# Patient Record
Sex: Male | Born: 1935 | Race: White | Hispanic: No | Smoking: Current some day smoker
Health system: Southern US, Community
[De-identification: ages and names within clinical notes are randomized; demographics above are authoritative.]

## PROBLEM LIST (undated history)

## (undated) DIAGNOSIS — I10 Essential (primary) hypertension: Secondary | ICD-10-CM

## (undated) DIAGNOSIS — I2510 Atherosclerotic heart disease of native coronary artery without angina pectoris: Secondary | ICD-10-CM

## (undated) DIAGNOSIS — I471 Supraventricular tachycardia, unspecified: Secondary | ICD-10-CM

## (undated) DIAGNOSIS — R911 Solitary pulmonary nodule: Secondary | ICD-10-CM

## (undated) DIAGNOSIS — F32A Depression, unspecified: Secondary | ICD-10-CM

## (undated) DIAGNOSIS — C61 Malignant neoplasm of prostate: Secondary | ICD-10-CM

## (undated) DIAGNOSIS — M109 Gout, unspecified: Secondary | ICD-10-CM

## (undated) DIAGNOSIS — Z86711 Personal history of pulmonary embolism: Secondary | ICD-10-CM

## (undated) DIAGNOSIS — K219 Gastro-esophageal reflux disease without esophagitis: Secondary | ICD-10-CM

## (undated) DIAGNOSIS — E7800 Pure hypercholesterolemia, unspecified: Secondary | ICD-10-CM

## (undated) DIAGNOSIS — F329 Major depressive disorder, single episode, unspecified: Secondary | ICD-10-CM

## (undated) DIAGNOSIS — I480 Paroxysmal atrial fibrillation: Secondary | ICD-10-CM

## (undated) HISTORY — PX: HERNIA REPAIR: SHX51

## (undated) HISTORY — PX: HEMORRHOID SURGERY: SHX153

## (undated) HISTORY — DX: Paroxysmal atrial fibrillation: I48.0

## (undated) HISTORY — DX: Supraventricular tachycardia: I47.1

## (undated) HISTORY — PX: FRACTURE SURGERY: SHX138

## (undated) HISTORY — PX: PROSTATE SURGERY: SHX751

## (undated) HISTORY — DX: Supraventricular tachycardia, unspecified: I47.10

## (undated) HISTORY — PX: TONSILLECTOMY: SUR1361

## (undated) HISTORY — DX: Atherosclerotic heart disease of native coronary artery without angina pectoris: I25.10

## (undated) HISTORY — DX: Malignant neoplasm of prostate: C61

## (undated) HISTORY — DX: Essential (primary) hypertension: I10

## (undated) HISTORY — DX: Gastro-esophageal reflux disease without esophagitis: K21.9

## (undated) HISTORY — DX: Solitary pulmonary nodule: R91.1

## (undated) HISTORY — PX: OTHER SURGICAL HISTORY: SHX169

## (undated) HISTORY — DX: Personal history of pulmonary embolism: Z86.711

---

## 1998-05-21 ENCOUNTER — Ambulatory Visit (HOSPITAL_COMMUNITY): Admission: RE | Admit: 1998-05-21 | Discharge: 1998-05-21 | Payer: Self-pay | Admitting: General Surgery

## 2000-08-13 ENCOUNTER — Encounter: Admission: RE | Admit: 2000-08-13 | Discharge: 2000-11-11 | Payer: Self-pay | Admitting: Orthopaedic Surgery

## 2001-08-16 ENCOUNTER — Other Ambulatory Visit: Admission: RE | Admit: 2001-08-16 | Discharge: 2001-08-16 | Payer: Self-pay | Admitting: Dermatology

## 2002-01-03 ENCOUNTER — Encounter: Admission: RE | Admit: 2002-01-03 | Discharge: 2002-04-03 | Payer: Self-pay | Admitting: Neurological Surgery

## 2003-05-08 ENCOUNTER — Other Ambulatory Visit: Admission: RE | Admit: 2003-05-08 | Discharge: 2003-05-08 | Payer: Self-pay | Admitting: Dermatology

## 2004-05-21 ENCOUNTER — Ambulatory Visit: Payer: Self-pay | Admitting: Family Medicine

## 2004-07-04 ENCOUNTER — Ambulatory Visit: Payer: Self-pay | Admitting: Family Medicine

## 2004-10-02 ENCOUNTER — Ambulatory Visit: Payer: Self-pay | Admitting: Family Medicine

## 2004-11-25 ENCOUNTER — Ambulatory Visit: Payer: Self-pay | Admitting: Family Medicine

## 2005-02-03 ENCOUNTER — Ambulatory Visit: Payer: Self-pay | Admitting: Family Medicine

## 2005-02-24 ENCOUNTER — Ambulatory Visit: Payer: Self-pay | Admitting: Family Medicine

## 2005-02-26 ENCOUNTER — Other Ambulatory Visit: Admission: RE | Admit: 2005-02-26 | Discharge: 2005-02-26 | Payer: Self-pay | Admitting: Dermatology

## 2005-02-28 ENCOUNTER — Ambulatory Visit: Payer: Self-pay | Admitting: Family Medicine

## 2005-03-07 ENCOUNTER — Ambulatory Visit: Payer: Self-pay | Admitting: Family Medicine

## 2005-04-04 ENCOUNTER — Ambulatory Visit: Payer: Self-pay | Admitting: Family Medicine

## 2005-05-26 ENCOUNTER — Ambulatory Visit: Payer: Self-pay | Admitting: Family Medicine

## 2005-10-09 ENCOUNTER — Ambulatory Visit: Payer: Self-pay | Admitting: Family Medicine

## 2005-12-15 ENCOUNTER — Ambulatory Visit: Payer: Self-pay | Admitting: Family Medicine

## 2006-02-09 ENCOUNTER — Ambulatory Visit: Payer: Self-pay | Admitting: Family Medicine

## 2006-04-10 ENCOUNTER — Ambulatory Visit: Payer: Self-pay | Admitting: Family Medicine

## 2006-06-17 ENCOUNTER — Ambulatory Visit: Payer: Self-pay | Admitting: Family Medicine

## 2006-09-08 ENCOUNTER — Ambulatory Visit: Payer: Self-pay | Admitting: Family Medicine

## 2006-11-04 ENCOUNTER — Ambulatory Visit: Payer: Self-pay | Admitting: Family Medicine

## 2006-11-16 ENCOUNTER — Ambulatory Visit: Payer: Self-pay | Admitting: Family Medicine

## 2007-11-10 ENCOUNTER — Encounter: Admission: RE | Admit: 2007-11-10 | Discharge: 2007-11-24 | Payer: Self-pay | Admitting: Orthopedic Surgery

## 2007-12-14 ENCOUNTER — Encounter
Admission: RE | Admit: 2007-12-14 | Discharge: 2008-02-15 | Payer: Self-pay | Admitting: Physical Medicine and Rehabilitation

## 2008-08-09 ENCOUNTER — Ambulatory Visit (HOSPITAL_COMMUNITY): Admission: RE | Admit: 2008-08-09 | Discharge: 2008-08-09 | Payer: Self-pay | Admitting: Ophthalmology

## 2008-08-25 ENCOUNTER — Ambulatory Visit: Payer: Self-pay | Admitting: Urology

## 2008-08-30 ENCOUNTER — Ambulatory Visit (HOSPITAL_COMMUNITY): Admission: RE | Admit: 2008-08-30 | Discharge: 2008-08-30 | Payer: Self-pay | Admitting: Ophthalmology

## 2008-10-12 ENCOUNTER — Ambulatory Visit (HOSPITAL_COMMUNITY): Admission: RE | Admit: 2008-10-12 | Discharge: 2008-10-12 | Payer: Self-pay | Admitting: Ophthalmology

## 2009-06-26 ENCOUNTER — Ambulatory Visit: Admission: RE | Admit: 2009-06-26 | Discharge: 2009-09-24 | Payer: Self-pay | Admitting: Radiation Oncology

## 2009-08-17 ENCOUNTER — Ambulatory Visit (HOSPITAL_BASED_OUTPATIENT_CLINIC_OR_DEPARTMENT_OTHER): Admission: RE | Admit: 2009-08-17 | Discharge: 2009-08-17 | Payer: Self-pay | Admitting: Urology

## 2009-09-24 ENCOUNTER — Ambulatory Visit: Admission: RE | Admit: 2009-09-24 | Discharge: 2009-10-04 | Payer: Self-pay | Admitting: Radiation Oncology

## 2009-11-02 IMAGING — US US RENAL KIDNEY
1 series · 17 of 25 positions shown · non-contrast
Comparison: none

REASON FOR EXAM: flank pain   BPH   hematuria
COMMENTS:

[Series 1: us renal kidney · 17 of 36 slices shown]
[im 1/36]
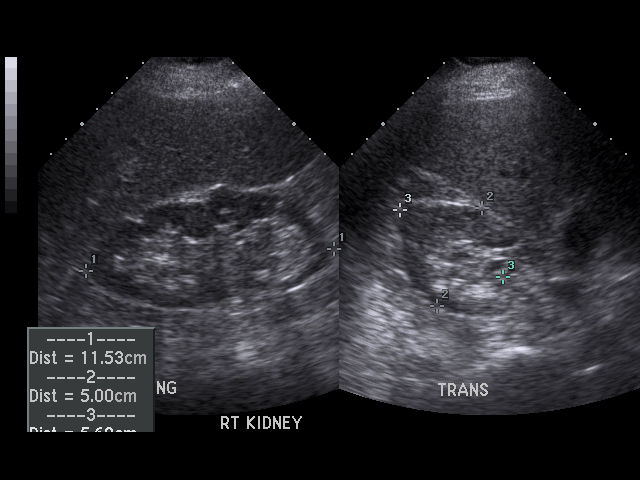
[im 3/36]
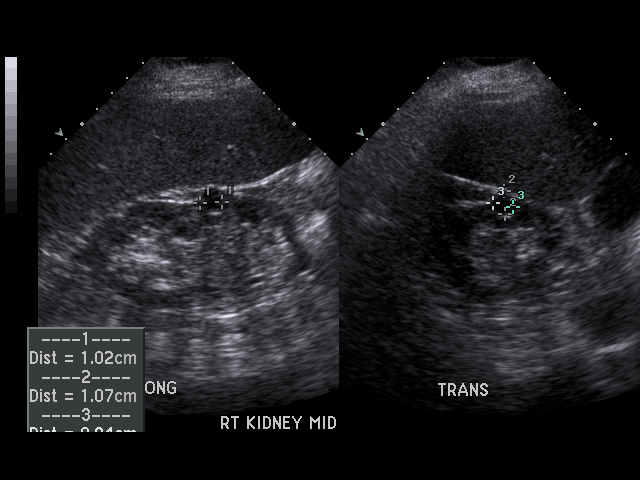
[im 5/36]
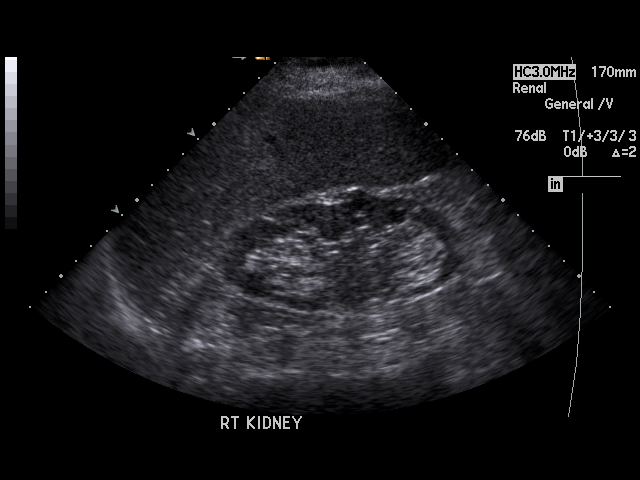
[im 8/36]
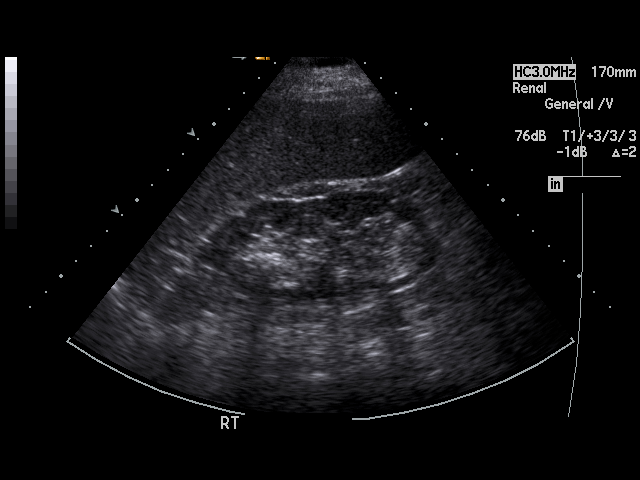
[im 9/36]
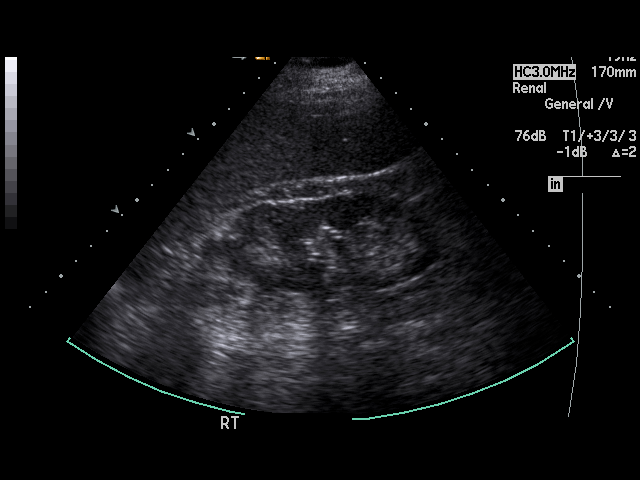
[im 12/36]
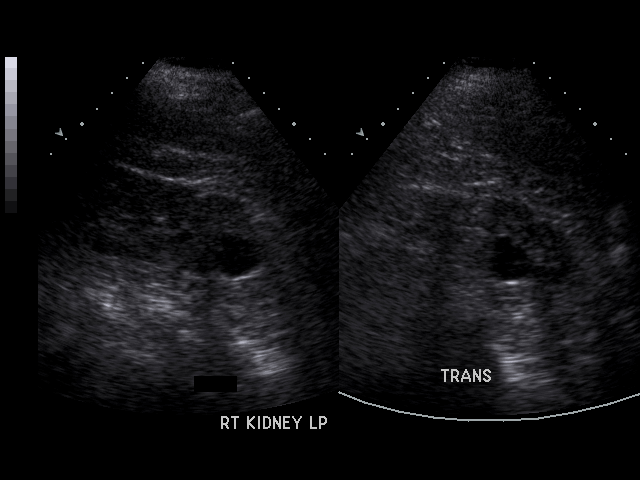
[im 14/36]
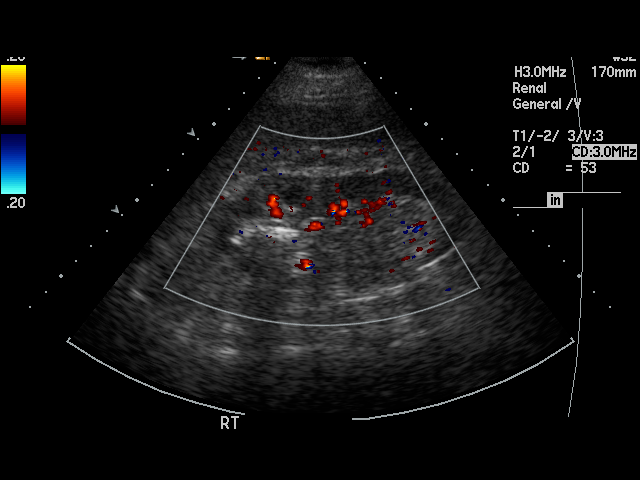
[im 17/36]
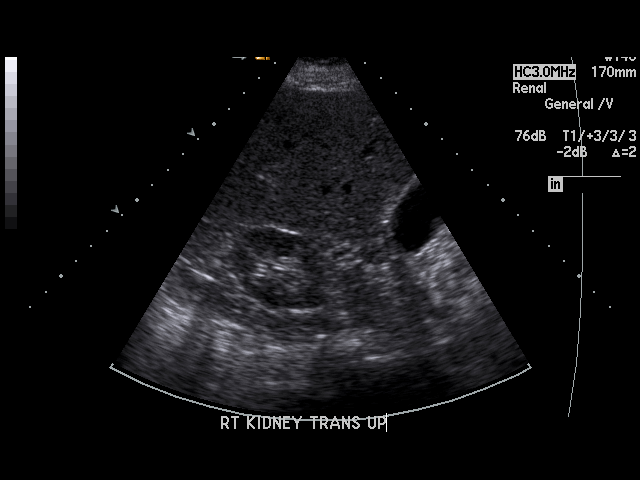
[im 18/36]
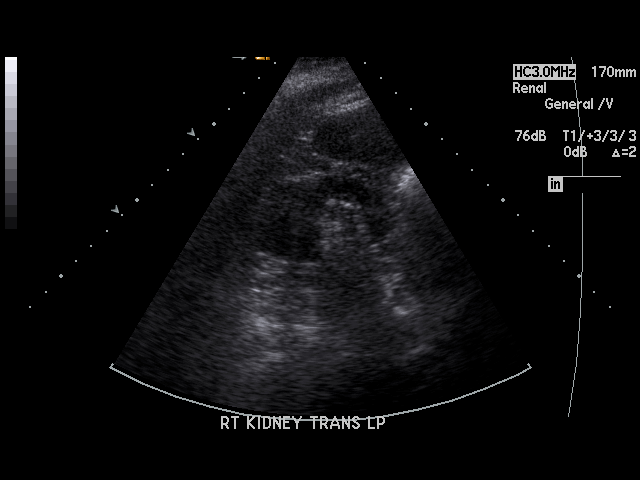
[im 19/36]
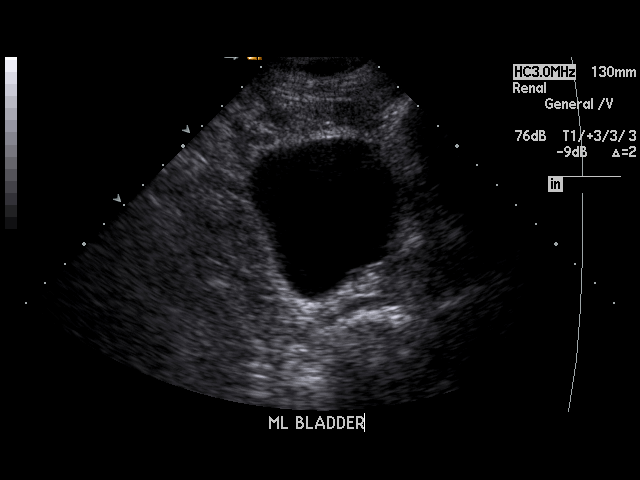
[im 22/36]
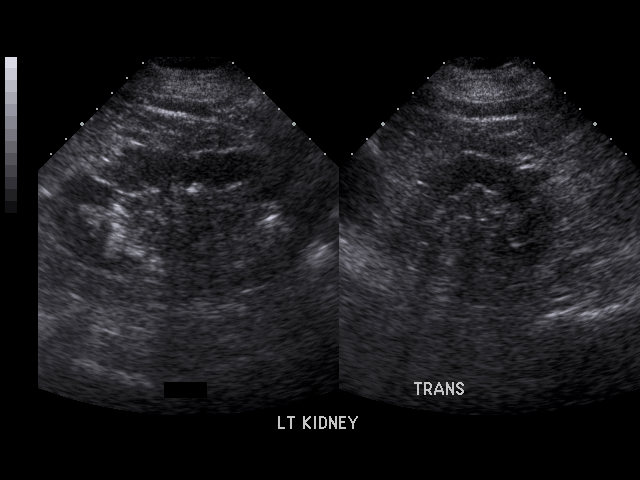
[im 24/36]
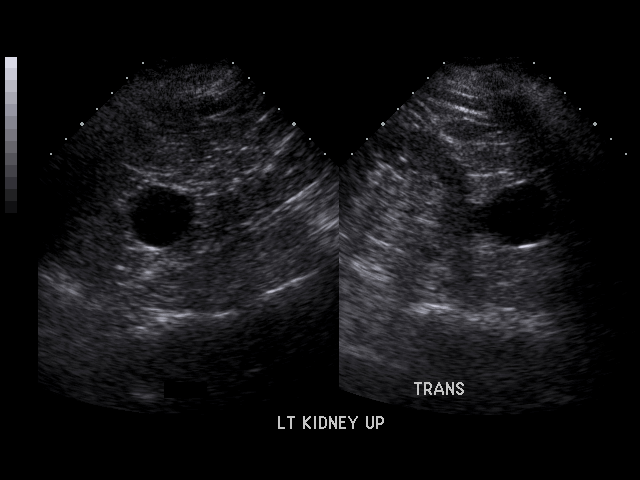
[im 27/36]
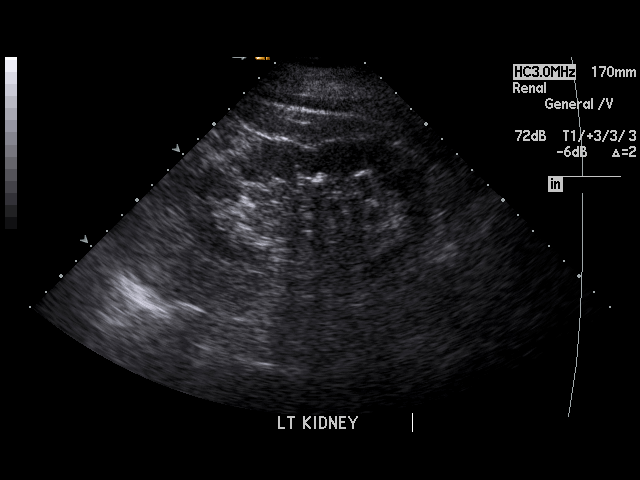
[im 28/36]
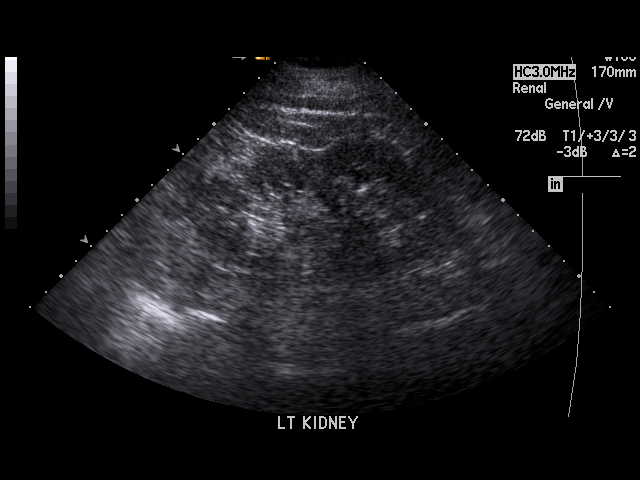
[im 31/36]
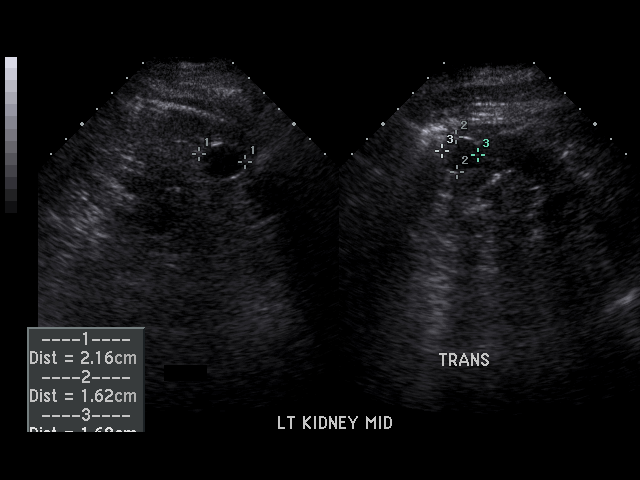
[im 33/36]
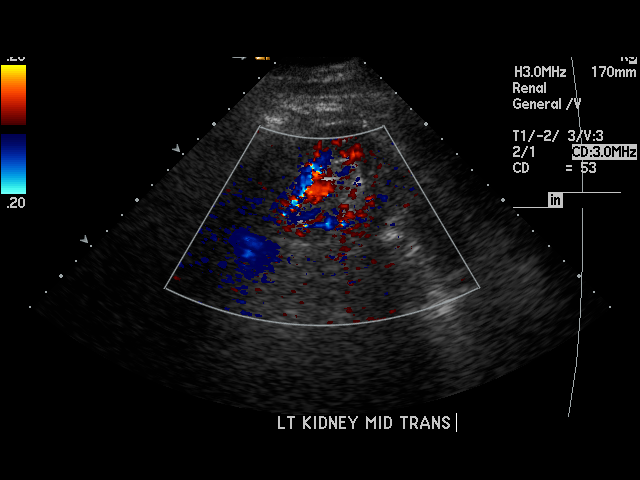
[im 36/36]
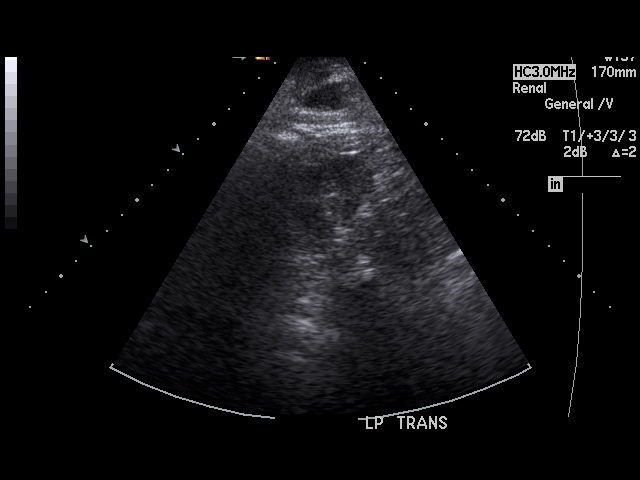

[17 of 25 positions shown; findings below may reference images not displayed]

PROCEDURE:     US  - US KIDNEY  - August 25, 2008  [DATE]

RESULT:     Sonographic investigation of the kidneys demonstrates the RIGHT
kidney measures 11.53 x 5.0 x 5.7 cm. The LEFT kidney measures 12.6 x 7.1 x
4.7 cm. There is a lower pole cyst in the RIGHT kidney measuring
approximately 1.8 x 2.1 x 1.6 cm. There is urine in the bladder. There is a
LEFT renal cyst in the upper pole measuring approximately 3 cm x 3.1 x
cm. There is a mid pole LEFT renal cyst measuring up to approximately
cm. Neither kidney demonstrates hydronephrosis. There is mild cortical
thinning.
IMPRESSION: Renal cysts. The study is otherwise unremarkable.

## 2010-09-05 LAB — COMPREHENSIVE METABOLIC PANEL
Albumin: 4.4 g/dL (ref 3.5–5.2)
Alkaline Phosphatase: 74 U/L (ref 39–117)
BUN: 8 mg/dL (ref 6–23)
Calcium: 10 mg/dL (ref 8.4–10.5)
Glucose, Bld: 92 mg/dL (ref 70–99)
Potassium: 4.9 mEq/L (ref 3.5–5.1)
Sodium: 141 mEq/L (ref 135–145)
Total Protein: 7.4 g/dL (ref 6.0–8.3)

## 2010-09-05 LAB — CBC
HCT: 43.8 % (ref 39.0–52.0)
Hemoglobin: 15.3 g/dL (ref 13.0–17.0)
MCHC: 34.8 g/dL (ref 30.0–36.0)
Platelets: 253 10*3/uL (ref 150–400)
RDW: 12.4 % (ref 11.5–15.5)

## 2010-09-05 LAB — PROTIME-INR
INR: 1.04 (ref 0.00–1.49)
Prothrombin Time: 13.5 seconds (ref 11.6–15.2)

## 2010-10-01 LAB — BASIC METABOLIC PANEL
Calcium: 9.8 mg/dL (ref 8.4–10.5)
GFR calc Af Amer: >60 mL/min (ref >60)
GFR calc non Af Amer: >60 mL/min (ref >60)
Potassium: 4.4 mEq/L (ref 3.5–5.1)
Sodium: 140 mEq/L (ref 135–145)

## 2010-10-01 LAB — HEMOGLOBIN AND HEMATOCRIT, BLOOD
HCT: 44.3 % (ref 39.0–52.0)
Hemoglobin: 15.4 g/dL (ref 13.0–17.0)

## 2013-01-10 ENCOUNTER — Emergency Department (HOSPITAL_COMMUNITY)
Admission: EM | Admit: 2013-01-10 | Discharge: 2013-01-10 | Disposition: A | Discharge status: Home / Self Care | Payer: Medicare Other | Attending: Emergency Medicine | Admitting: Emergency Medicine

## 2013-01-10 ENCOUNTER — Emergency Department (HOSPITAL_COMMUNITY): Payer: Medicare Other

## 2013-01-10 ENCOUNTER — Encounter (HOSPITAL_COMMUNITY): Payer: Self-pay

## 2013-01-10 DIAGNOSIS — M25519 Pain in unspecified shoulder: Secondary | ICD-10-CM | POA: Insufficient documentation

## 2013-01-10 DIAGNOSIS — K219 Gastro-esophageal reflux disease without esophagitis: Secondary | ICD-10-CM | POA: Insufficient documentation

## 2013-01-10 DIAGNOSIS — Z8639 Personal history of other endocrine, nutritional and metabolic disease: Secondary | ICD-10-CM | POA: Insufficient documentation

## 2013-01-10 DIAGNOSIS — R079 Chest pain, unspecified: Secondary | ICD-10-CM

## 2013-01-10 DIAGNOSIS — Z79899 Other long term (current) drug therapy: Secondary | ICD-10-CM | POA: Insufficient documentation

## 2013-01-10 DIAGNOSIS — Z8546 Personal history of malignant neoplasm of prostate: Secondary | ICD-10-CM | POA: Insufficient documentation

## 2013-01-10 DIAGNOSIS — R5381 Other malaise: Secondary | ICD-10-CM | POA: Insufficient documentation

## 2013-01-10 DIAGNOSIS — R5383 Other fatigue: Secondary | ICD-10-CM | POA: Insufficient documentation

## 2013-01-10 DIAGNOSIS — R0789 Other chest pain: Secondary | ICD-10-CM | POA: Insufficient documentation

## 2013-01-10 DIAGNOSIS — R0602 Shortness of breath: Secondary | ICD-10-CM | POA: Insufficient documentation

## 2013-01-10 DIAGNOSIS — E7800 Pure hypercholesterolemia, unspecified: Secondary | ICD-10-CM | POA: Insufficient documentation

## 2013-01-10 DIAGNOSIS — I10 Essential (primary) hypertension: Secondary | ICD-10-CM | POA: Insufficient documentation

## 2013-01-10 DIAGNOSIS — Z8659 Personal history of other mental and behavioral disorders: Secondary | ICD-10-CM | POA: Insufficient documentation

## 2013-01-10 DIAGNOSIS — Z862 Personal history of diseases of the blood and blood-forming organs and certain disorders involving the immune mechanism: Secondary | ICD-10-CM | POA: Insufficient documentation

## 2013-01-10 DIAGNOSIS — Z87891 Personal history of nicotine dependence: Secondary | ICD-10-CM | POA: Insufficient documentation

## 2013-01-10 DIAGNOSIS — R42 Dizziness and giddiness: Secondary | ICD-10-CM | POA: Insufficient documentation

## 2013-01-10 HISTORY — DX: Depression, unspecified: F32.A

## 2013-01-10 HISTORY — DX: Gout, unspecified: M10.9

## 2013-01-10 HISTORY — DX: Pure hypercholesterolemia, unspecified: E78.00

## 2013-01-10 HISTORY — DX: Major depressive disorder, single episode, unspecified: F32.9

## 2013-01-10 LAB — CBC WITH DIFFERENTIAL/PLATELET
Basophils Absolute: 0 10*3/uL (ref 0.0–0.1)
Basophils Relative: 0 % (ref 0–1)
Eosinophils Absolute: 0.1 10*3/uL (ref 0.0–0.7)
Eosinophils Relative: 1 % (ref 0–5)
HCT: 39.5 % (ref 39.0–52.0)
Hemoglobin: 13.7 g/dL (ref 13.0–17.0)
Lymphocytes Relative: 28 % (ref 12–46)
Lymphs Abs: 2.8 10*3/uL (ref 0.7–4.0)
MCH: 34.8 pg — ABNORMAL HIGH (ref 26.0–34.0)
MCHC: 34.7 g/dL (ref 30.0–36.0)
MCV: 100.3 fL — ABNORMAL HIGH (ref 78.0–100.0)
Monocytes Absolute: 1.3 10*3/uL — ABNORMAL HIGH (ref 0.1–1.0)
Monocytes Relative: 13 % — ABNORMAL HIGH (ref 3–12)
Neutro Abs: 5.7 10*3/uL (ref 1.7–7.7)
Neutrophils Relative %: 58 % (ref 43–77)
Platelets: 182 10*3/uL (ref 150–400)
RBC: 3.94 MIL/uL — ABNORMAL LOW (ref 4.22–5.81)
RDW: 12.3 % (ref 11.5–15.5)
WBC: 9.9 10*3/uL (ref 4.0–10.5)

## 2013-01-10 LAB — BASIC METABOLIC PANEL
BUN: 9 mg/dL (ref 6–23)
CO2: 28 mEq/L (ref 19–32)
Calcium: 9.8 mg/dL (ref 8.4–10.5)
Chloride: 98 mEq/L (ref 96–112)
Creatinine, Ser: 0.64 mg/dL (ref 0.50–1.35)
GFR calc Af Amer: >90 mL/min (ref >90)
GFR calc non Af Amer: >90 mL/min (ref >90)
Glucose, Bld: 161 mg/dL — ABNORMAL HIGH (ref 70–99)
Potassium: 3.1 mEq/L — ABNORMAL LOW (ref 3.5–5.1)
Sodium: 136 mEq/L (ref 135–145)

## 2013-01-10 LAB — TROPONIN I: Troponin I: <0.3 ng/mL (ref <0.30)

## 2013-01-10 MED ORDER — POTASSIUM CHLORIDE CRYS ER 20 MEQ PO TBCR
40.0000 meq | EXTENDED_RELEASE_TABLET | Freq: Once | ORAL | Status: AC
  Administered 2013-01-10: 40 meq via ORAL
  Filled 2013-01-10: qty 2

## 2013-01-10 NOTE — ED Notes (Signed)
Pt reports left side cp all weekend, pain has comes and gone all weekend, but has been constant since 8am. No radiation, sob at times, pain to left shoulder at times this weekend also, denies any n/d or fever. No cough.

## 2013-01-10 NOTE — ED Provider Notes (Signed)
CSN: [id number]     Arrival date & time 01/10/13  1340 History  This chart was scribed for Jonathon Razor, MD, by Yevette Edwards, ED Scribe. This patient was seen in room APA06/APA06 and the patient's care was started at 2:33 PM.   First MD Initiated Contact with Patient 01/10/13 1412     Chief Complaint  Patient presents with  . Chest Pain    The history is provided by the patient. No language interpreter was used.   HPI Comments: Jonathon Conway is a 79 y.o. male who presents to the Emergency Department complaining of waxing and waning left-sided chest pain which began three days ago. No appreciable exacerbating or relieving factors. If does not completely go away.  Currently "nagging." Poorly localized to midsternum to L chest. He states that at its worst, the pain can be sharp and achy. He also states that the pain is not similar to his prior episodes of acid reflux. When the chest pain began three days ago, he visited his PCP where an EKG was performed with no significant results. When his PCP called to follow-up with him this morning, she encouraged him to visit the ED since he was still experiencing the chest pain.The pt states that he has also experienced intermittent pain to his left shoulder and SOB as associated symptoms. He also experienced an episode of dizziness recently while at his uncle's funeral; the pt denies increased pain prior to the dizziness. The pt also reports increased weakness and fatigue with exertion; he reports the weakness and fatigue has been occurring for the past year, but has worsened in the past month. He denies experiencing any pain to his left arm, a cough, nausea, emesis, diarrhea, diaphoresis, leg swelling, or a fever.   The pt denies taking any aspirin on a regular basis. He denies a h/o cardiac issues or any recent visits with cardiologists. Per the pt, his mother has a h/o of cardiac issues and a pacemaker although he is unsure of specifics. His 75y old brother  had quadruple bypass a few years ago as well as a carotid artery blockage. The pt has a h/ o  HTN and acid reflux. He is a smoker, and he denies using alcohol.  Has not had a stress test in numerous years. No previous catheterization.     Past Medical History  Diagnosis Date  . Hypertension   . Hypercholesteremia   . Depression   . Acid reflux   . Gout   . Cancer    Past Surgical History  Procedure Laterality Date  . Fracture surgery    . Hemorrhoid surgery    . Hernia repair    . Prostate surgery     No family history on file. History  Substance Use Topics  . Smoking status: Former Games developer  . Smokeless tobacco: Not on file  . Alcohol Use: No    Review of Systems  Constitutional: Negative for fever and diaphoresis.  Respiratory: Positive for shortness of breath. Negative for cough.   Cardiovascular: Positive for chest pain. Negative for leg swelling.  Gastrointestinal: Negative for nausea, vomiting and diarrhea.  Musculoskeletal: Positive for myalgias (Left shoulder pain. ).  Neurological: Positive for dizziness (Occasional dizzy spells. ) and weakness (Ongoing for a year, but has worsened in the past month. ).  All other systems reviewed and are negative.    Allergies  Asa  Home Medications   Current Outpatient Rx  Name  Route  Sig  Dispense  Refill  . allopurinol (ZYLOPRIM) 100 MG tablet   Oral   Take 100 mg by mouth daily.         Marland Kitchen amLODipine (NORVASC) 5 MG tablet   Oral   Take 5 mg by mouth daily.         Marland Kitchen escitalopram (LEXAPRO) 10 MG tablet   Oral   Take 10 mg by mouth daily.         Marland Kitchen esomeprazole (NEXIUM) 40 MG capsule   Oral   Take 40 mg by mouth 2 (two) times daily.         Marland Kitchen HYDROcodone-acetaminophen (NORCO/VICODIN) 5-325 MG per tablet   Oral   Take 1 tablet by mouth every 6 (six) hours as needed for pain.         . Multiple Vitamin (MULTIVITAMIN) tablet   Oral   Take 1 tablet by mouth daily.         . pravastatin (PRAVACHOL)  40 MG tablet   Oral   Take 40 mg by mouth at bedtime.         . pyridOXINE (VITAMIN B-6) 100 MG tablet   Oral   Take 100 mg by mouth daily.         . vitamin E 400 UNIT capsule   Oral   Take 400 Units by mouth daily.          Triage Vitals: BP 147/77  Pulse 103  Temp(Src) 98 F (36.7 C) (Oral)  Resp 18  Ht 5\' 10"  (1.778 m)  Wt 160 lb (72.576 kg)  BMI 22.96 kg/m2  SpO2 97%  Physical Exam  Nursing note and vitals reviewed. Constitutional: He is oriented to person, place, and time. He appears well-developed and well-nourished.  Non-toxic appearance. He does not appear ill. No distress.  HENT:  Head: Normocephalic and atraumatic.  Right Ear: External ear normal.  Left Ear: External ear normal.  Nose: Nose normal. No mucosal edema or rhinorrhea.  Mouth/Throat: Oropharynx is clear and moist and mucous membranes are normal. No dental abscesses or edematous.  Eyes: Conjunctivae and EOM are normal. Pupils are equal, round, and reactive to light.  Neck: Normal range of motion and full passive range of motion without pain. Neck supple.  Cardiovascular: Normal rate, regular rhythm and normal heart sounds.  Exam reveals no gallop and no friction rub.   No murmur heard. Chest pain not reproducible with palpation or ROM with left shoulder.   Pulmonary/Chest: Effort normal and breath sounds normal. No respiratory distress. He has no wheezes. He has no rhonchi. He has no rales. He exhibits no tenderness and no crepitus.  Abdominal: Soft. Normal appearance and bowel sounds are normal. He exhibits no distension. There is no tenderness. There is no rebound and no guarding.  Musculoskeletal: Normal range of motion. He exhibits no edema and no tenderness.  Moves all extremities well.  No edema. No calf tenderness.   Neurological: He is alert and oriented to person, place, and time. He has normal strength. No cranial nerve deficit.  Skin: Skin is warm, dry and intact. No rash noted. No  erythema. No pallor.  Psychiatric: He has a normal mood and affect. His speech is normal and behavior is normal. His mood appears not anxious.    ED Course   Medications - No data to display  DIAGNOSTIC STUDIES: Oxygen Saturation is 97% on room air, normal by my interpretation.    COORDINATION OF CARE:  2:43 PM- Discussed treatment plan with  patient which includes blood work and a chest x-ray, and the patient agreed to the plan.  Informed the pt that if he is discharged, he needed to have a stress test ASAP.   Procedures (including critical care time)  Labs Reviewed  CBC WITH DIFFERENTIAL - Abnormal; Notable for the following:    RBC 3.94 (*)    MCV 100.3 (*)    MCH 34.8 (*)    Monocytes Relative 13 (*)    Monocytes Absolute 1.3 (*)    All other components within normal limits  BASIC METABOLIC PANEL - Abnormal; Notable for the following:    Potassium 3.1 (*)    Glucose, Bld 161 (*)    All other components within normal limits  TROPONIN I   Dg Chest 2 View  01/10/2013   *RADIOLOGY REPORT*  Clinical Data: Chest pain  CHEST - 2 VIEW  Comparison: 04/23/2009  Findings: Hyperinflation suggesting emphysema persists.  No focal pulmonary opacity.  There is a linear radiopacity projecting over the right lung base anteriorly, new since prior exam.  No pleural effusion.  Heart size is normal.  No acute osseous finding.  IMPRESSION: No acute cardiopulmonary process.  Hyperinflation suggesting emphysema again noted.  Linear radiopacity stenting of the right lung base anteriorly which could be within the soft tissues, overlying the patient seen obliquely, or represent sequela of interval unknown treatment.   Original Report Authenticated By: Christiana Pellant, M.D.   EKG:  Rhythm: normal sinus Vent. rate 92 BPM PR interval 136 ms QRS duration 100 ms QT/QTc 354/437 ms IRBBB ST segments: NS ST changes   No results found. 1. Chest pain     MDM  80yM with CP. Some features (poorly  localized and somewhat vague pain, radiation to shoulder, and mild SOB) typical for ACS. Constant duration for such an extended period of time is not though. Pt with no known CAD, but risk factors including HTN, hypercholesterolemia and smoking. Troponin normal and given the constant duration for >48 hours at this point, I do not feel that doing serial troponins would likely add much clinically. I feel pt is in need of some provocative testing such as a stress.  I personally preformed the services scribed in my presence. The recorded information has been reviewed is accurate. Jonathon Razor, MD.    Jonathon Razor, MD 01/12/13 [phone]

## 2013-01-12 ENCOUNTER — Ambulatory Visit (INDEPENDENT_AMBULATORY_CARE_PROVIDER_SITE_OTHER): Payer: Medicare Other | Admitting: Cardiology

## 2013-01-12 ENCOUNTER — Telehealth: Payer: Self-pay | Admitting: Cardiology

## 2013-01-12 ENCOUNTER — Encounter: Payer: Self-pay | Admitting: *Deleted

## 2013-01-12 ENCOUNTER — Encounter: Payer: Self-pay | Admitting: Cardiology

## 2013-01-12 VITALS — BP 148/80 | HR 75 | Ht 70.0 in | Wt 158.0 lb

## 2013-01-12 DIAGNOSIS — K219 Gastro-esophageal reflux disease without esophagitis: Secondary | ICD-10-CM

## 2013-01-12 DIAGNOSIS — E782 Mixed hyperlipidemia: Secondary | ICD-10-CM

## 2013-01-12 DIAGNOSIS — I10 Essential (primary) hypertension: Secondary | ICD-10-CM

## 2013-01-12 DIAGNOSIS — R072 Precordial pain: Secondary | ICD-10-CM

## 2013-01-12 NOTE — Progress Notes (Signed)
Clinical Summary Jonathon Conway is a 79 y.o.male referred for cardiology consultation by Dr. Lysbeth Galas. Patient was just recently seen in the ER on 7/28 with chest pain. He was seen by Dr. Juleen China - note reviewed.  Patient tells me that he woke up this past Friday in the early morning hours with a heaviness in his chest also associated with belching, he thought that it was reflux and took some Tum's. He reports having increasing reflux symptoms and a bitter taste in his throat despite treatment with Nexium. His belching improved but he still had a lingering low-level discomfort in his chest for several hours, actually through the weekend, ultimately prompting his evaluation in the ER. He mentions that he had eaten a tomato sandwich and had lemonade on the day prior to the symptoms. Since then he has cut back acidic foods and is feeling better. He is not reporting any chest pain today.  Recent ECG showed sinus rhythm with incomplete right bundle branch block, left anterior fascicular block, no significant ST segment changes. Lab work showed hemoglobin 13.7, platelets 182, potassium 3.1, BUN 9, creatinine 0.6, troponin I less than 0.30. Chest x-ray reported no acute cardiopulmonary process, possible emphysematous changes.  He reports having a stress test approximately 40 years ago. No personal diagnosis of CAD or myocardial infarction. Has had less energy over the last year, not noted abruptly. Remains active with his church, other civic and fraternal organizations.   Allergies  Allergen Reactions  . Asa (Aspirin) Anaphylaxis    Current Outpatient Prescriptions  Medication Sig Dispense Refill  . allopurinol (ZYLOPRIM) 100 MG tablet Take 100 mg by mouth daily.      Marland Kitchen amLODipine (NORVASC) 5 MG tablet Take 5 mg by mouth daily.      Marland Kitchen escitalopram (LEXAPRO) 10 MG tablet Take 10 mg by mouth daily.      Marland Kitchen esomeprazole (NEXIUM) 40 MG capsule Take 40 mg by mouth 2 (two) times daily.      Marland Kitchen  HYDROcodone-acetaminophen (NORCO/VICODIN) 5-325 MG per tablet Take 1 tablet by mouth every 6 (six) hours as needed for pain.      . Multiple Vitamin (MULTIVITAMIN) tablet Take 1 tablet by mouth daily.      . pravastatin (PRAVACHOL) 40 MG tablet Take 40 mg by mouth at bedtime.      . vitamin E 400 UNIT capsule Take 400 Units by mouth daily.      Marland Kitchen pyridOXINE (VITAMIN B-6) 100 MG tablet Take 100 mg by mouth daily.       No current facility-administered medications for this visit.    Past Medical History  Diagnosis Date  . Essential hypertension, benign   . Hypercholesteremia   . Depression   . Acid reflux   . Gout   . Prostate cancer     Past Surgical History  Procedure Laterality Date  . Fracture surgery    . Hemorrhoid surgery    . Hernia repair    . Prostate surgery      Family History  Problem Relation Age of Onset  . Arrhythmia Mother     Pacemaker  . CAD Brother     CABG    Social History Jonathon Conway reports that he has quit smoking. His smoking use included Cigarettes. He smoked 0.00 packs per day. He does not have any smokeless tobacco history on file. Jonathon Conway reports that he does not drink alcohol.  Review of Systems Hard of hearing. Uses hearing aids. No dysphagia. No  fevers or chills. No cough or hemoptysis. Stable appetite. No changes in bowel or bladder habits. No palpitations or syncope. Otherwise negative.  Physical Examination Filed Vitals:   01/12/13 1349  BP: 148/80  Pulse: 75   Filed Weights   01/12/13 1349  Weight: 158 lb (71.668 kg)   Appears comfortable at rest. HEENT: Conjunctiva and lids normal, oropharynx clear. Hearing aids in place. Neck: Supple, no elevated JVP or carotid bruits, no thyromegaly. Lungs: Clear to auscultation, nonlabored breathing at rest. Cardiac: Precordium with prominent PMI, but no heave. Regular rate and rhythm, no S3, soft basal systolic murmur, preserved second heart sound, no pericardial rub. Abdomen: Soft,  nontender, bowel sounds present, no guarding or rebound. Extremities: No pitting edema, distal pulses 2+. Skin: Warm and dry. Musculoskeletal: No kyphosis. Neuropsychiatric: Alert and oriented x3, affect grossly appropriate.   Problem List and Plan   Precordial pain Typical and atypical features, including prolonged symptoms without dynamic ST segment changes by ECG and normal cardiac markers per recent ER visit. He does mention accelerating reflux symptoms and this could potentially be gastrointestinal in etiology. Having said that, he does have a history of hypertension and hyperlipidemia for years, some family history of cardiovascular disease, also risk factors including age and gender. He has had no recent ischemic evaluation. Will followup with an exercise Cardiolite and inform him of the results.  Essential hypertension, benign Followed by Dr. Lysbeth Galas. He continues on Norvasc.  Mixed hyperlipidemia Followed by Dr. Lysbeth Galas, on Pravachol.  GERD (gastroesophageal reflux disease) Long-standing history, on Nexium for years. States that he was diagnosed with "an infection" in his stomach over 20 years ago. If his cardiac workup is unrevealing, could consider referral to a gastroenterologist for endoscopy to exclude other esophageal or gastric pathologies.    Jonelle Sidle, M.D., F.A.C.C.

## 2013-01-12 NOTE — Telephone Encounter (Signed)
Exercise stress cardiolite set for 01-14-13 @ [facility]

## 2013-01-12 NOTE — Telephone Encounter (Signed)
No precert required 

## 2013-01-12 NOTE — Assessment & Plan Note (Signed)
Typical and atypical features, including prolonged symptoms without dynamic ST segment changes by ECG and normal cardiac markers per recent ER visit. He does mention accelerating reflux symptoms and this could potentially be gastrointestinal in etiology. Having said that, he does have a history of hypertension and hyperlipidemia for years, some family history of cardiovascular disease, also risk factors including age and gender. He has had no recent ischemic evaluation. Will followup with an exercise Cardiolite and inform him of the results.

## 2013-01-12 NOTE — Assessment & Plan Note (Signed)
Followed by Dr. Lysbeth Galas, on Pravachol.

## 2013-01-12 NOTE — Patient Instructions (Addendum)
Your physician recommends that you continue on your current medications as directed. Please refer to the Current Medication list given to you today. Your physician has requested that you have en exercise stress myoview. For further information please visit [email or web address]. Please follow instruction sheet, as given.  We will call you with your results. 

## 2013-01-12 NOTE — Assessment & Plan Note (Signed)
Followed by Dr. Lysbeth Galas. He continues on Norvasc.

## 2013-01-12 NOTE — Assessment & Plan Note (Signed)
Long-standing history, on Nexium for years. States that he was diagnosed with "an infection" in his stomach over 20 years ago. If his cardiac workup is unrevealing, could consider referral to a gastroenterologist for endoscopy to exclude other esophageal or gastric pathologies.

## 2013-01-20 ENCOUNTER — Telehealth: Payer: Self-pay | Admitting: *Deleted

## 2013-01-20 MED ORDER — NITROGLYCERIN 0.4 MG SL SUBL: 0.4000 mg | SUBLINGUAL_TABLET | SUBLINGUAL | Status: AC | PRN

## 2013-01-20 MED ORDER — CLOPIDOGREL BISULFATE 75 MG PO TABS: 75.0000 mg | ORAL_TABLET | Freq: Every day | ORAL | Status: DC

## 2013-01-20 NOTE — Telephone Encounter (Signed)
Patient informed and uses [facility] in [place]   ----- Message ----- From: Prescott Parma, PA-C Sent: 01/20/2013 7:56 AM To: Eustace Moore, LPN, Jonelle Sidle, MD Subject: FW: Scan Abnormal stress test with medium sized scar, but no definite ischemia; NL LVF. Recent OV note reviewed. Pt presented with no known CAD, but several CRFs. Symptoms deemed typical/atypical. Recommend f/u OV next week with SM to discuss test results, and possible cardiac catheterization. Also, pt is allergic to ASA (anaphylaxis). Recommend starting Plavix 75 mg daily and adding prn NTG 0.4 mg.    ----- Message ----- From: Megan Salon Sent: 01/19/2013 4:15 PM To: Prescott Parma, PA-C Subject: Scan The image below was scanned by Megan Salon [[id number]] on 01/19/2013 at 4:15 PM to the following: MYOCARDIAL PERFUSION IMAGING [[id number]] on

## 2013-01-25 ENCOUNTER — Encounter: Payer: Self-pay | Admitting: Cardiology

## 2013-01-25 ENCOUNTER — Ambulatory Visit (INDEPENDENT_AMBULATORY_CARE_PROVIDER_SITE_OTHER): Payer: Medicare Other | Admitting: Cardiology

## 2013-01-25 VITALS — BP 137/83 | HR 75 | Ht 70.0 in | Wt 157.1 lb

## 2013-01-25 DIAGNOSIS — E782 Mixed hyperlipidemia: Secondary | ICD-10-CM

## 2013-01-25 DIAGNOSIS — R9439 Abnormal result of other cardiovascular function study: Secondary | ICD-10-CM | POA: Insufficient documentation

## 2013-01-25 DIAGNOSIS — I10 Essential (primary) hypertension: Secondary | ICD-10-CM

## 2013-01-25 NOTE — Assessment & Plan Note (Signed)
Reviewed with the patient today. He reports no further chest pain symptoms, does have reflux. Not having any exertional component. It is reassuring to note that he had no diagnostic ST segment abnormalities, LVEF is normal without wall motion abnormalities. The inferior defect could be due to attenuation artifact, although scar is not excluded. At this point we have elected to continue observation on medical therapy. He did not want to pursue cardiac catheterization now, although would reconsider it if his symptoms worsen. Followup arranged.

## 2013-01-25 NOTE — Assessment & Plan Note (Signed)
Continue on statin therapy. 

## 2013-01-25 NOTE — Progress Notes (Signed)
Clinical Summary Mr. Scaife is a 79 y.o.male seen recently in consultation in late July secondary to chest pain symptoms, fairly prolonged, typical and atypical features, and not associated with any dynamic ST segment changes or abnormal cardiac markers based on ER visit. Some features suggested possible reflux or other GI etiology. With active cardiac risk factors he was referred for ischemic testing.  Exercise Cardiolite on 8/1, read by Dr. Purvis Sheffield, showed no diagnostic ST segment abnormalities. There was a medium sized inferior/inferior septal defect of moderate intensity, fixed and described as suggestive of scar. LVEF was normal at 67% however, and there were no wall motion abnormalities. No definite ischemia.  I discussed the results of the test with him today. He reports no further chest pain symptoms since his last visit, still has reflux. No exertional component. We have discussed options including cardiac catheterization for definitive assessment of coronary anatomy, versus continuing medical therapy for treatment of atherosclerosis and observing symptoms.   Allergies  Allergen Reactions  . Asa (Aspirin) Anaphylaxis    Current Outpatient Prescriptions  Medication Sig Dispense Refill  . allopurinol (ZYLOPRIM) 100 MG tablet Take 100 mg by mouth daily.      Marland Kitchen amLODipine (NORVASC) 5 MG tablet Take 5 mg by mouth daily.      Marland Kitchen escitalopram (LEXAPRO) 10 MG tablet Take 10 mg by mouth daily.      Marland Kitchen esomeprazole (NEXIUM) 40 MG capsule Take 40 mg by mouth 2 (two) times daily.      Marland Kitchen HYDROcodone-acetaminophen (NORCO/VICODIN) 5-325 MG per tablet Take 1 tablet by mouth every 6 (six) hours as needed for pain.      . Multiple Vitamin (MULTIVITAMIN) tablet Take 1 tablet by mouth daily.      . pravastatin (PRAVACHOL) 40 MG tablet Take 40 mg by mouth at bedtime.      . pyridOXINE (VITAMIN B-6) 100 MG tablet Take 100 mg by mouth daily.      . vitamin E 400 UNIT capsule Take 400 Units by mouth  daily.      . clopidogrel (PLAVIX) 75 MG tablet Take 1 tablet (75 mg total) by mouth daily.  30 tablet  3  . nitroGLYCERIN (NITROSTAT) 0.4 MG SL tablet Place 1 tablet (0.4 mg total) under the tongue every 5 (five) minutes as needed for chest pain. Up to 3 doses. If no relief after 3rd dose, proceed to ED for evaluation.  25 tablet  3   No current facility-administered medications for this visit.    Past Medical History  Diagnosis Date  . Essential hypertension, benign   . Hypercholesteremia   . Depression   . Acid reflux   . Gout   . Prostate cancer     Past Surgical History  Procedure Laterality Date  . Fracture surgery    . Hemorrhoid surgery    . Hernia repair    . Prostate surgery      Family History  Problem Relation Age of Onset  . Arrhythmia Mother     Pacemaker  . CAD Brother     CABG    Social History Mr. Amory reports that he has been smoking Cigarettes.  He has been smoking about 0.00 packs per day. He does not have any smokeless tobacco history on file. Mr. Hinzman reports that he does not drink alcohol.  Review of Systems  Negative except as outlined.  Physical Examination Filed Vitals:   01/25/13 0844  BP: 137/83  Pulse: 75   Filed Weights  01/25/13 0844  Weight: 157 lb 1.9 oz (71.269 kg)    Appears comfortable at rest.  HEENT: Conjunctiva and lids normal, oropharynx clear. Hearing aids in place.  Neck: Supple, no elevated JVP or carotid bruits, no thyromegaly.  Lungs: Clear to auscultation, nonlabored breathing at rest.  Cardiac: Precordium with prominent PMI, but no heave. Regular rate and rhythm, no S3, soft basal systolic murmur, preserved second heart sound, no pericardial rub.  Abdomen: Soft, nontender, bowel sounds present, no guarding or rebound.  Extremities: No pitting edema, distal pulses 2+.    Problem List and Plan   Abnormal myocardial perfusion study Reviewed with the patient today. He reports no further chest pain symptoms,  does have reflux. Not having any exertional component. It is reassuring to note that he had no diagnostic ST segment abnormalities, LVEF is normal without wall motion abnormalities. The inferior defect could be due to attenuation artifact, although scar is not excluded. At this point we have elected to continue observation on medical therapy. He did not want to pursue cardiac catheterization now, although would reconsider it if his symptoms worsen. Followup arranged.  Essential hypertension, benign Continue current management.  Mixed hyperlipidemia Continue on statin therapy.    Jonelle Sidle, M.D., F.A.C.C.

## 2013-01-25 NOTE — Assessment & Plan Note (Signed)
Continue current management

## 2013-01-25 NOTE — Patient Instructions (Addendum)

## 2013-02-04 ENCOUNTER — Encounter (HOSPITAL_COMMUNITY): Payer: Self-pay | Admitting: *Deleted

## 2013-02-04 ENCOUNTER — Inpatient Hospital Stay (HOSPITAL_COMMUNITY)
Admission: EM | Admit: 2013-02-04 | Discharge: 2013-02-07 | DRG: 176 | Disposition: A | Discharge status: Home / Self Care | Payer: Medicare Other | Attending: Family Medicine | Admitting: Family Medicine

## 2013-02-04 ENCOUNTER — Telehealth: Payer: Self-pay | Admitting: *Deleted

## 2013-02-04 ENCOUNTER — Emergency Department (HOSPITAL_COMMUNITY): Payer: Medicare Other

## 2013-02-04 DIAGNOSIS — C61 Malignant neoplasm of prostate: Secondary | ICD-10-CM | POA: Diagnosis present

## 2013-02-04 DIAGNOSIS — R091 Pleurisy: Secondary | ICD-10-CM | POA: Diagnosis present

## 2013-02-04 DIAGNOSIS — Z7902 Long term (current) use of antithrombotics/antiplatelets: Secondary | ICD-10-CM

## 2013-02-04 DIAGNOSIS — T17408A Unspecified foreign body in trachea causing other injury, initial encounter: Secondary | ICD-10-CM | POA: Diagnosis present

## 2013-02-04 DIAGNOSIS — F17200 Nicotine dependence, unspecified, uncomplicated: Secondary | ICD-10-CM | POA: Diagnosis present

## 2013-02-04 DIAGNOSIS — I2510 Atherosclerotic heart disease of native coronary artery without angina pectoris: Secondary | ICD-10-CM | POA: Diagnosis present

## 2013-02-04 DIAGNOSIS — IMO0002 Reserved for concepts with insufficient information to code with codable children: Secondary | ICD-10-CM | POA: Diagnosis present

## 2013-02-04 DIAGNOSIS — R319 Hematuria, unspecified: Secondary | ICD-10-CM

## 2013-02-04 DIAGNOSIS — I2699 Other pulmonary embolism without acute cor pulmonale: Principal | ICD-10-CM

## 2013-02-04 DIAGNOSIS — F329 Major depressive disorder, single episode, unspecified: Secondary | ICD-10-CM | POA: Diagnosis present

## 2013-02-04 DIAGNOSIS — R911 Solitary pulmonary nodule: Secondary | ICD-10-CM | POA: Diagnosis present

## 2013-02-04 DIAGNOSIS — M109 Gout, unspecified: Secondary | ICD-10-CM | POA: Diagnosis present

## 2013-02-04 DIAGNOSIS — F3289 Other specified depressive episodes: Secondary | ICD-10-CM | POA: Diagnosis present

## 2013-02-04 DIAGNOSIS — I10 Essential (primary) hypertension: Secondary | ICD-10-CM | POA: Diagnosis present

## 2013-02-04 DIAGNOSIS — R9439 Abnormal result of other cardiovascular function study: Secondary | ICD-10-CM | POA: Diagnosis present

## 2013-02-04 DIAGNOSIS — Z886 Allergy status to analgesic agent status: Secondary | ICD-10-CM

## 2013-02-04 DIAGNOSIS — E7800 Pure hypercholesterolemia, unspecified: Secondary | ICD-10-CM | POA: Diagnosis present

## 2013-02-04 DIAGNOSIS — K219 Gastro-esophageal reflux disease without esophagitis: Secondary | ICD-10-CM

## 2013-02-04 LAB — BASIC METABOLIC PANEL
BUN: 11 mg/dL (ref 6–23)
CO2: 28 mEq/L (ref 19–32)
Calcium: 10.2 mg/dL (ref 8.4–10.5)
Glucose, Bld: 100 mg/dL — ABNORMAL HIGH (ref 70–99)
Potassium: 4 mEq/L (ref 3.5–5.1)
Sodium: 136 mEq/L (ref 135–145)

## 2013-02-04 LAB — CBC
HCT: 43.3 % (ref 39.0–52.0)
Hemoglobin: 14.9 g/dL (ref 13.0–17.0)
MCH: 34.4 pg — ABNORMAL HIGH (ref 26.0–34.0)
MCHC: 34.4 g/dL (ref 30.0–36.0)
RBC: 4.33 MIL/uL (ref 4.22–5.81)

## 2013-02-04 LAB — TROPONIN I: Troponin I: <0.3 ng/mL (ref <0.30)

## 2013-02-04 LAB — D-DIMER, QUANTITATIVE: D-Dimer, Quant: 0.92 ug/mL-FEU — ABNORMAL HIGH (ref 0.00–0.48)

## 2013-02-04 MED ORDER — HYDROCODONE-ACETAMINOPHEN 5-325 MG PO TABS
1.0000 | ORAL_TABLET | ORAL | Status: DC | PRN
  Administered 2013-02-04: 2 via ORAL
  Administered 2013-02-05 – 2013-02-06 (×7): 1 via ORAL
  Administered 2013-02-06 (×3): 2 via ORAL
  Administered 2013-02-07: 1 via ORAL
  Administered 2013-02-07: 2 via ORAL
  Filled 2013-02-04: qty 1
  Filled 2013-02-04: qty 2
  Filled 2013-02-04: qty 1
  Filled 2013-02-04: qty 2
  Filled 2013-02-04: qty 1
  Filled 2013-02-04 (×2): qty 2
  Filled 2013-02-04 (×2): qty 1
  Filled 2013-02-04: qty 2
  Filled 2013-02-04 (×3): qty 1

## 2013-02-04 MED ORDER — ONDANSETRON HCL 4 MG/2ML IJ SOLN: 4.0000 mg | Freq: Four times a day (QID) | INTRAMUSCULAR | Status: DC | PRN

## 2013-02-04 MED ORDER — NITROGLYCERIN 0.4 MG SL SUBL: 0.4000 mg | SUBLINGUAL_TABLET | SUBLINGUAL | Status: DC | PRN

## 2013-02-04 MED ORDER — POLYETHYLENE GLYCOL 3350 17 G PO PACK: 17.0000 g | PACK | Freq: Every day | ORAL | Status: DC | PRN

## 2013-02-04 MED ORDER — ESCITALOPRAM OXALATE 10 MG PO TABS
10.0000 mg | ORAL_TABLET | Freq: Every day | ORAL | Status: DC
  Administered 2013-02-05 – 2013-02-07 (×3): 10 mg via ORAL
  Filled 2013-02-04 (×3): qty 1

## 2013-02-04 MED ORDER — ACETAMINOPHEN 325 MG PO TABS: 650.0000 mg | ORAL_TABLET | Freq: Four times a day (QID) | ORAL | Status: DC | PRN

## 2013-02-04 MED ORDER — MORPHINE SULFATE 2 MG/ML IJ SOLN: 2.0000 mg | INTRAMUSCULAR | Status: DC | PRN

## 2013-02-04 MED ORDER — ONDANSETRON HCL 4 MG PO TABS: 4.0000 mg | ORAL_TABLET | Freq: Four times a day (QID) | ORAL | Status: DC | PRN

## 2013-02-04 MED ORDER — SODIUM CHLORIDE 0.9 % IJ SOLN: 3.0000 mL | INTRAMUSCULAR | Status: DC | PRN

## 2013-02-04 MED ORDER — SODIUM CHLORIDE 0.9 % IV SOLN: 250.0000 mL | INTRAVENOUS | Status: DC | PRN

## 2013-02-04 MED ORDER — SIMVASTATIN 20 MG PO TABS
20.0000 mg | ORAL_TABLET | Freq: Every day | ORAL | Status: DC
  Administered 2013-02-04 – 2013-02-06 (×3): 20 mg via ORAL
  Filled 2013-02-04 (×3): qty 1

## 2013-02-04 MED ORDER — IOHEXOL 350 MG/ML SOLN
100.0000 mL | Freq: Once | INTRAVENOUS | Status: AC | PRN
  Administered 2013-02-04: 100 mL via INTRAVENOUS

## 2013-02-04 MED ORDER — FAMOTIDINE 20 MG PO TABS
20.0000 mg | ORAL_TABLET | Freq: Two times a day (BID) | ORAL | Status: DC
  Administered 2013-02-04 – 2013-02-07 (×6): 20 mg via ORAL
  Filled 2013-02-04 (×6): qty 1

## 2013-02-04 MED ORDER — ACETAMINOPHEN 650 MG RE SUPP: 650.0000 mg | Freq: Four times a day (QID) | RECTAL | Status: DC | PRN

## 2013-02-04 MED ORDER — SODIUM CHLORIDE 0.9 % IJ SOLN
3.0000 mL | Freq: Two times a day (BID) | INTRAMUSCULAR | Status: DC
  Administered 2013-02-04 – 2013-02-07 (×6): 3 mL via INTRAVENOUS

## 2013-02-04 MED ORDER — RIVAROXABAN 10 MG PO TABS
15.0000 mg | ORAL_TABLET | Freq: Two times a day (BID) | ORAL | Status: DC
  Administered 2013-02-04 – 2013-02-07 (×6): 15 mg via ORAL
  Filled 2013-02-04: qty 2
  Filled 2013-02-04 (×4): qty 1
  Filled 2013-02-04: qty 2
  Filled 2013-02-04 (×4): qty 1

## 2013-02-04 MED ORDER — ALLOPURINOL 100 MG PO TABS
100.0000 mg | ORAL_TABLET | Freq: Every day | ORAL | Status: DC
  Administered 2013-02-04 – 2013-02-07 (×4): 100 mg via ORAL
  Filled 2013-02-04 (×4): qty 1

## 2013-02-04 MED ORDER — AMLODIPINE BESYLATE 5 MG PO TABS
5.0000 mg | ORAL_TABLET | Freq: Every day | ORAL | Status: DC
  Administered 2013-02-04 – 2013-02-07 (×4): 5 mg via ORAL
  Filled 2013-02-04 (×4): qty 1

## 2013-02-04 MED ORDER — SODIUM CHLORIDE 0.9 % IJ SOLN
3.0000 mL | Freq: Two times a day (BID) | INTRAMUSCULAR | Status: DC
  Filled 2013-02-04: qty 3

## 2013-02-04 NOTE — Telephone Encounter (Signed)
Patient c/o sharp, right sided chest pain since last night rated 8 and today 5 on a scale of 1-10 (10 being the greatest). Patient took nitroglycerin x's 1 last night with no relief. Patient didn't take anymore after that. Patient said that he can't take a deep breath without feeling pain. Today patient said he took hydrocodone/apap 5/325 mg at 8 :30 am this morning and felt relief afterwards. Patient c/o sob and slight dizziness, also cold sweating. Patient said that the pain is located on the right side of his chest and he is actively having pain while on the phone. Nurse advised him to take his nitroglycerin and proceed to ED for the symptoms that he is describing. Patient verbalized understanding of plan.

## 2013-02-04 NOTE — ED Notes (Signed)
RA O2 of 91-93%. O2 at 2L via [place] applied.

## 2013-02-04 NOTE — H&P (Signed)
History and Physical  Jonathon Conway ZOX:[id number] DOB: Oct 26, 1931 DOA: 02/04/2013  Referring physician: Dr. Estell Harpin PCP: Josue Hector, MD   Chief Complaint: Chest pain  HPI:  79 year old man presented to the emergency department with complaint of right-sided chest pain. Initial evaluation was notable for positive stable EKG, negative troponin but d-dimer positive. Subsequent chest CT revealed right-sided pulmonary embolism.  The patient was recently seen in the emergency Department 7/28 for chest pain. Although he has severe GERD symptoms he had some typical features and therefore was referred to cardiology for outpatient stress testing which was abnormal but thought to be scar. He was seen by cardiology 8/12 and options were offered including catheterization the patient desired medical management. Since that time the patient done quite well he has had no recurrence of that kind of chest pain.   Last night he developed right-sided pleuritic chest pain, aggravated by taking a deep breath. This pain was quite different from his previous chest pain. No frank shortness of breath. He took some GERD medication and went to sleep. This morning the pain was still present, he took Lortab which helped but did not relieve the pain. Because of continued pleurisy he came to the emergency department. He has no previous history of blood clots. No leg swelling or leg pain.  In the emergency department he was noted to be afebrile with stable vital signs. No hypoxia.  Review of Systems:  Negative for fever, visual changes, sore throat, rash, new muscle aches, dysuria, bleeding, n/v/abdominal pain.CBC, troponin, BNP and basic metabolic panel are unremarkable. Chest x-ray was unremarkable except for a small metal fragment on the right side of the chest. Chest CT demonstrated pulmonary embolism. EKG was nonacute.   Past Medical History  Diagnosis Date  . Essential hypertension, benign   .  Hypercholesteremia   . Depression   . Acid reflux   . Gout   . Prostate cancer     Past Surgical History  Procedure Laterality Date  . Fracture surgery    . Hemorrhoid surgery    . Hernia repair    . Prostate surgery      prostate seed implantation  . Left elbow surgery      tendon repair?  . Tonsillectomy      Social History:  reports that he has been smoking Cigarettes.  He has been smoking about 0.00 packs per day. He does not have any smokeless tobacco history on file. He reports that he drinks about 12.0 ounces of alcohol per week. He reports that he does not use illicit drugs.  Allergies  Allergen Reactions  . Asa [Aspirin] Anaphylaxis    Family History  Problem Relation Age of Onset  . Arrhythmia Mother     Pacemaker  . CAD Brother     CABG     Prior to Admission medications   Medication Sig Start Date End Date Taking? Authorizing Provider  allopurinol (ZYLOPRIM) 100 MG tablet Take 100 mg by mouth daily.   Yes Historical Provider, MD  amLODipine (NORVASC) 5 MG tablet Take 5 mg by mouth daily.   Yes Historical Provider, MD  escitalopram (LEXAPRO) 10 MG tablet Take 10 mg by mouth daily.   Yes Historical Provider, MD  HYDROcodone-acetaminophen (NORCO/VICODIN) 5-325 MG per tablet Take 1 tablet by mouth every 6 (six) hours as needed for pain.   Yes Historical Provider, MD  Multiple Vitamin (MULTIVITAMIN) tablet Take 1 tablet by mouth daily.   Yes Historical Provider, MD  nitroGLYCERIN (NITROSTAT)  0.4 MG SL tablet Place 1 tablet (0.4 mg total) under the tongue every 5 (five) minutes as needed for chest pain. Up to 3 doses. If no relief after 3rd dose, proceed to ED for evaluation. 01/20/13  Yes Prescott Parma, PA-C  pravastatin (PRAVACHOL) 40 MG tablet Take 40 mg by mouth at bedtime.   Yes Historical Provider, MD  pyridOXINE (VITAMIN B-6) 100 MG tablet Take 100 mg by mouth daily.   Yes Historical Provider, MD  ranitidine (ZANTAC) 150 MG tablet Take 150 mg by mouth 2 (two)  times daily.   Yes Historical Provider, MD  vitamin E 400 UNIT capsule Take 400 Units by mouth daily.   Yes Historical Provider, MD  clopidogrel (PLAVIX) 75 MG tablet Take 1 tablet (75 mg total) by mouth daily. 01/20/13 01/20/14  Prescott Parma, PA-C   Physical Exam: Filed Vitals:   02/04/13 1500 02/04/13 1600 02/04/13 1652 02/04/13 1700  BP: 130/69 146/76 146/76 150/77  Pulse: 75 77 74 76  Temp:      TempSrc: Oral     Resp: 16 13 16 13   Height:      Weight:      SpO2: 98%  96%    General: Examined in the emergency department. Appears calm and comfortable unless he takes a deep breath. Appears nontoxic. Eyes:PERRL, wears glasses. Normal lids, irises  ENT: Hard of hearing. Lips and tongue appear unremarkable. Neck: no LAD, masses or thyromegaly Cardiovascular: RRR, no m/r/g. No LE edema. Respiratory: CTA bilaterally, no w/r/r. Normal respiratory effort. Abdomen: soft, ntnd Skin: no rash or induration seen  Musculoskeletal: grossly normal tone BUE/BLE Psychiatric: grossly normal mood and affect, speech fluent and appropriate Neurologic: grossly non-focal.  Wt Readings from Last 3 Encounters:  02/04/13 71.668 kg (158 lb)  01/25/13 71.269 kg (157 lb 1.9 oz)  01/12/13 71.668 kg (158 lb)    Labs on Admission:  Basic Metabolic Panel:  Recent Labs Lab 02/04/13 1238  NA 136  K 4.0  CL 95*  CO2 28  GLUCOSE 100*  BUN 11  CREATININE 0.80  CALCIUM 10.2   CBC:  Recent Labs Lab 02/04/13 1238  WBC 10.5  HGB 14.9  HCT 43.3  MCV 100.0  PLT 210    Cardiac Enzymes:  Recent Labs Lab 02/04/13 1238  TROPONINI <0.30     Recent Labs  02/04/13 1238  PROBNP 153.1     Radiological Exams on Admission: Ct Angio Chest Pe W/cm &/or Wo Cm  02/04/2013   *RADIOLOGY REPORT*  Clinical Data: Chest pain  CT ANGIOGRAPHY CHEST  Technique:  Multidetector CT imaging of the chest using the standard protocol during bolus administration of intravenous contrast. Multiplanar reconstructed  images including MIPs were obtained and reviewed to evaluate the vascular anatomy.  Contrast: OMNIPAQUE IOHEXOL 350 MG/ML SOLN  Comparison: Chest x-ray obtained earlier today at 12:33 p.m.  Findings:  Mediastinum: Unremarkable CT appearance of the thyroid gland.  No suspicious mediastinal or hilar adenopathy.   An 8 mm inferior right hilar lymph node is not enlarged by CT criteria.  No soft tissue mediastinal mass.  The thoracic esophagus is unremarkable.  Heart/Vascular: Adequate opacification of the pulmonary arteries to the proximal subsegmental level.   Filling defect within the right middle lobar pulmonary artery extending into segmental and subsegmental branches.  There is associated peripheral ground-glass attenuation opacity extending to the pleural surface consistent with alveolar hemorrhage versus parenchymal infarct.  The atherosclerotic calcifications are identified in the left main, and left anterior  descending coronary arteries.  The heart is within normal limits for size.  There is no pericardial effusion. Conventional three-vessel aortic arch.  Atherosclerotic calcifications noted throughout the transverse aorta.  No aneurysmal dilatation or acute dissection.  Lungs/Pleura: Irregular spiculated pulmonary nodule in the right upper lobe demonstrates a faint peripheral ground-glass attenuation halo.  The overall size of the nodular opacity including of the subsolid component measures 1.7 cm.  The solid component measures up to 1.1 cm.  A linear 5 mm metallic fragment is noted within the right middle lobe.  This was seen on the prior chest x-ray from 01/10/2013 was not present on earlier imaging. Dependent atelectasis in the bilateral lower lobes.  Upper Abdomen: Limited imaging of the upper abdomen is unremarkable.  Bones: No acute fracture or aggressive appearing lytic or blastic osseous lesion.  IMPRESSION:  1.  Positive for acute right middle lobe are pulmonary embolus with associated alveolar  hemorrhage versus early parenchymal infarct peripherally.  2.  Solid and semisolid 1.7 cm spiculated right upper lobe pulmonary nodule highly concerning for primary bronchogenic carcinoma (adenocarcinoma favored).  Recommend further evaluation with PET CT.  3.  Linear 5 mm metallic fragment within the parenchyma of the right middle lobe which is seen on prior chest x-rays from earlier today and 01/10/2013.  This may have been aspirated, or potentially embolized.  4.  Atherosclerosis including multivessel coronary artery disease.  Critical Value/emergent results were called by telephone at the time of interpretation on 02/04/2013 at 03:50 p.m. to Dr. Estell Harpin, who verbally acknowledged these results.   Original Report Authenticated By: Malachy Moan, M.D.   Dg Chest Port 1 View  02/04/2013   *RADIOLOGY REPORT*  Clinical Data: Chest pain  PORTABLE CHEST - 1 VIEW  Comparison: 01/10/2013  Findings: The cardiac shadow is stable.  The lungs are clear bilaterally.  The previously seen radiopaque density over the right lung base is again seen.  IMPRESSION: No acute abnormality noted.   Original Report Authenticated By: Alcide Clever, M.D.    EKG: Independently reviewed. Normal sinus rhythm, incomplete right bundle branch block. No acute changes. Compared to previous study no significant changes seen.   Principal Problem:   Acute pulmonary embolism Active Problems:   GERD (gastroesophageal reflux disease)   Abnormal myocardial perfusion study   Coronary artery disease   Assessment/Plan 1. Acute RML pulmonary embolus: with associated chest pain, alveolar hemorrhage versus early parenchymal infarct peripherally. Discussed with Dr. Delford Field, can proceed with Xarelto or Lovenox/Coumadin and discharge when medically stable. Discussed with patient, will start Xarelto. 2. RUL spiculate nodule: concerning for primary bronchogenic carcinoma (adenocarcinoma favored). Recommend further evaluation with PET CT as  outpatient. 3. Linear 5 mm metallic fragment within the parenchyma of the right middle lobe: Highly suspicious for embolized prostate seed. This fragment is approximately 4 cm distal to the same area of the pulmonary embolus and is not felt to be related to her discussion with radiology. Followup as an outpatient for further recommendations. 4. Multivessel coronary artery disease seen on chest CT, also history of abnormal myocardial perfusion study. Followup with cardiology as an outpatient. Currently appears to be asymptomatic.He is allergic to aspirin  5. GERD: Poorly controlled with daily symptoms. consider PPI.  6. Hypertension   Code Status: Full code DVT prophylaxis: anticoagulated Family Communication: discussed with niece at bedside, patient has no POA Disposition Plan/Anticipated LOS: admission 2-3 days  Time spent: 80 minutes, discussion with radiology and pulmonary critical care   Brendia Sacks, MD  [facility] Pager [phone] 02/04/2013, 5:25 PM

## 2013-02-04 NOTE — ED Notes (Signed)
MD in room at this time. Assessing pt.

## 2013-02-04 NOTE — ED Notes (Signed)
C/o right sided cp, worse with deep breath since last night.  C/o sob and diaphoresis.

## 2013-02-04 NOTE — ED Provider Notes (Signed)
CSN: [id number]     Arrival date & time 02/04/13  1207 History   This chart was scribed for Benny Lennert, MD by Blanchard Kelch, ED Scribe. The patient was seen in room APA18/APA18. Patient's care was started at 1:14 PM.     Chief Complaint  Patient presents with  . Chest Pain    Patient is a 79 y.o. male presenting with chest pain.  Chest Pain Pain location:  R chest Pain quality: sharp   Pain severity:  Severe Timing:  Constant Progression:  Waxing and waning Worsened by:  Movement and deep breathing Associated symptoms: no abdominal pain, no back pain, no cough, no fatigue, no fever and no headache     HPI Comments: Jonathon Conway is a 79 y.o. male who presents to the Emergency Department complaining of sharp, constant, waxing and waning right sided chest pain that began last night. Patient complains of associated acid reflux. Pain is worsened with movement and deep breathing. Patient was here a month ago for his heart and was prescribed an anticoagulant and nitroglycerin. He denies any cough, fever or emesis.   PCP is Dr. Lysbeth Galas.  Past Medical History  Diagnosis Date  . Essential hypertension, benign   . Hypercholesteremia   . Depression   . Acid reflux   . Gout   . Prostate cancer    Past Surgical History  Procedure Laterality Date  . Fracture surgery    . Hemorrhoid surgery    . Hernia repair    . Prostate surgery     Family History  Problem Relation Age of Onset  . Arrhythmia Mother     Pacemaker  . CAD Brother     CABG   History  Substance Use Topics  . Smoking status: Current Some Day Smoker    Types: Cigarettes  . Smokeless tobacco: Not on file  . Alcohol Use: No    Review of Systems  Constitutional: Negative for fever, appetite change and fatigue.  HENT: Negative for congestion, sinus pressure and ear discharge.   Eyes: Negative for discharge.  Respiratory: Negative for cough.   Cardiovascular: Positive for chest pain.  Gastrointestinal:  Negative for abdominal pain and diarrhea.  Genitourinary: Negative for frequency and hematuria.  Musculoskeletal: Negative for back pain.  Skin: Negative for rash.  Neurological: Negative for seizures and headaches.  Psychiatric/Behavioral: Negative for hallucinations.    Allergies  Asa  Home Medications   Current Outpatient Rx  Name  Route  Sig  Dispense  Refill  . allopurinol (ZYLOPRIM) 100 MG tablet   Oral   Take 100 mg by mouth daily.         Marland Kitchen amLODipine (NORVASC) 5 MG tablet   Oral   Take 5 mg by mouth daily.         . clopidogrel (PLAVIX) 75 MG tablet   Oral   Take 1 tablet (75 mg total) by mouth daily.   30 tablet   3     New   . escitalopram (LEXAPRO) 10 MG tablet   Oral   Take 10 mg by mouth daily.         Marland Kitchen esomeprazole (NEXIUM) 40 MG capsule   Oral   Take 40 mg by mouth 2 (two) times daily.         Marland Kitchen HYDROcodone-acetaminophen (NORCO/VICODIN) 5-325 MG per tablet   Oral   Take 1 tablet by mouth every 6 (six) hours as needed for pain.         Marland Kitchen  Multiple Vitamin (MULTIVITAMIN) tablet   Oral   Take 1 tablet by mouth daily.         . nitroGLYCERIN (NITROSTAT) 0.4 MG SL tablet   Sublingual   Place 1 tablet (0.4 mg total) under the tongue every 5 (five) minutes as needed for chest pain. Up to 3 doses. If no relief after 3rd dose, proceed to ED for evaluation.   25 tablet   3   . pravastatin (PRAVACHOL) 40 MG tablet   Oral   Take 40 mg by mouth at bedtime.         . pyridOXINE (VITAMIN B-6) 100 MG tablet   Oral   Take 100 mg by mouth daily.         . vitamin E 400 UNIT capsule   Oral   Take 400 Units by mouth daily.          Triage Vitals: BP 151/75  Pulse 86  Temp(Src) 98.4 F (36.9 C) (Oral)  Resp 20  Ht 5\' 10"  (1.778 m)  Wt 158 lb (71.668 kg)  BMI 22.67 kg/m2  SpO2 95%  Physical Exam  Nursing note and vitals reviewed. Constitutional: He is oriented to person, place, and time. He appears well-developed.  HENT:   Head: Normocephalic.  Eyes: Conjunctivae and EOM are normal. No scleral icterus.  Neck: Neck supple. No thyromegaly present.  Cardiovascular: Normal rate and regular rhythm.  Exam reveals no gallop and no friction rub.   No murmur heard. Pulmonary/Chest: No stridor. He has no wheezes. He has no rales. He exhibits no tenderness.  Abdominal: He exhibits no distension. There is no tenderness. There is no rebound.  Musculoskeletal: Normal range of motion. He exhibits no edema.  Lymphadenopathy:    He has no cervical adenopathy.  Neurological: He is oriented to person, place, and time. Coordination normal.  Skin: No rash noted. No erythema.  Psychiatric: He has a normal mood and affect. His behavior is normal.    ED Course   DIAGNOSTIC STUDIES:  Oxygen Saturation is 95% on room air, normal by my interpretation.    COORDINATION OF CARE:  1:22 PM - Patient verbalizes understanding and agrees with treatment plan.      Procedures (including critical care time)  Labs Reviewed  CBC - Abnormal; Notable for the following:    MCH 34.4 (*)    All other components within normal limits  BASIC METABOLIC PANEL - Abnormal; Notable for the following:    Chloride 95 (*)    Glucose, Bld 100 (*)    GFR calc non Af Amer 83 (*)    All other components within normal limits  D-DIMER, QUANTITATIVE - Abnormal; Notable for the following:    D-Dimer, Quant 0.92 (*)    All other components within normal limits  PRO B NATRIURETIC PEPTIDE  TROPONIN I   Dg Chest Port 1 View  02/04/2013   *RADIOLOGY REPORT*  Clinical Data: Chest pain  PORTABLE CHEST - 1 VIEW  Comparison: 01/10/2013  Findings: The cardiac shadow is stable.  The lungs are clear bilaterally.  The previously seen radiopaque density over the right lung base is again seen.  IMPRESSION: No acute abnormality noted.   Original Report Authenticated By: Alcide Clever, M.D.   No diagnosis found.  Date: 02/04/2013  Rate:80  Rhythm: normal sinus  rhythm  QRS Axis: normal  Intervals: normal  ST/T Wave abnormalities: nonspecific ST changes  Conduction Disutrbances:left anterior fascicular block  Narrative Interpretation:   Old EKG Reviewed:  unchanged CRITICAL CARE Performed by: Tennelle Taflinger L Total critical care time:35 Critical care time was exclusive of separately billable procedures and treating other patients. Critical care was necessary to treat or prevent imminent or life-threatening deterioration. Critical care was time spent personally by me on the following activities: development of treatment plan with patient and/or surrogate as well as nursing, discussions with consultants, evaluation of patient's response to treatment, examination of patient, obtaining history from patient or surrogate, ordering and performing treatments and interventions, ordering and review of laboratory studies, ordering and review of radiographic studies, pulse oximetry and re-evaluation of patient's condition.   MDM  pe  The chart was scribed for me under my direct supervision.  I personally performed the history, physical, and medical decision making and all procedures in the evaluation of this patient.Benny Lennert, MD 02/04/13 [phone]

## 2013-02-05 ENCOUNTER — Inpatient Hospital Stay (HOSPITAL_COMMUNITY): Payer: Medicare Other

## 2013-02-05 DIAGNOSIS — I2510 Atherosclerotic heart disease of native coronary artery without angina pectoris: Secondary | ICD-10-CM

## 2013-02-05 LAB — CBC
HCT: 40.6 % (ref 39.0–52.0)
Hemoglobin: 14 g/dL (ref 13.0–17.0)
MCHC: 34.5 g/dL (ref 30.0–36.0)
MCV: 101.2 fL — ABNORMAL HIGH (ref 78.0–100.0)
RDW: 12.8 % (ref 11.5–15.5)

## 2013-02-05 LAB — BASIC METABOLIC PANEL
Calcium: 9.6 mg/dL (ref 8.4–10.5)
GFR calc Af Amer: >90 mL/min (ref >90)
Glucose, Bld: 104 mg/dL — ABNORMAL HIGH (ref 70–99)
Sodium: 134 mEq/L — ABNORMAL LOW (ref 135–145)

## 2013-02-05 MED ORDER — BOOST / RESOURCE BREEZE PO LIQD
1.0000 | Freq: Every day | ORAL | Status: DC
  Administered 2013-02-05 – 2013-02-06 (×2): 1 via ORAL

## 2013-02-05 MED ORDER — POTASSIUM CHLORIDE CRYS ER 20 MEQ PO TBCR
40.0000 meq | EXTENDED_RELEASE_TABLET | Freq: Three times a day (TID) | ORAL | Status: AC
  Administered 2013-02-05 (×3): 40 meq via ORAL
  Filled 2013-02-05 (×3): qty 2

## 2013-02-05 NOTE — Progress Notes (Signed)
INITIAL NUTRITION ASSESSMENT  DOCUMENTATION CODES Per approved criteria  -Not Applicable   INTERVENTION: Resource Breeze po qHS, each supplement provides 250 kcal and 9 grams of protein.  NUTRITION DIAGNOSIS: Inadequate oral intake related to decreased appetite as evidenced by unplanned wt loss of 4# past month.   Goal: Pt to meet >/= 90% of their estimated nutrition needs   Monitor:  Po intake, labs and wt trends  Reason for Assessment: Malnutrition Screen Score = 2  79 y.o. male   ASSESSMENT: Patient Active Problem List   Diagnosis Date Noted  . Acute pulmonary embolism 02/04/2013  . Coronary artery disease 02/04/2013  . Abnormal myocardial perfusion study 01/25/2013  . Precordial pain 01/12/2013  . Essential hypertension, benign 01/12/2013  . Mixed hyperlipidemia 01/12/2013  . GERD (gastroesophageal reflux disease) 01/12/2013   Pt very pleasant gentlemen who says appetite is much better today and relates to the medicine he's receiving po's 90% breakfast this morning. He reports modest wt loss over past 4-6 weeks; not significant. He had previously maintained stable wt since boot camp. He takes Multivitamins at home but is inconsistent. Home diet is Regular.  Diagnoses include: Acute pulmonary embolus, RUL spiculate nodule (to be followed-up as outpatient), GERD.   He is at risk for malnutrition due to his unplanned wt loss, decreased hunger prior to admission. Will follow his meal and supplement intake and re-evaluate as indicated.  Height: Ht Readings from Last 1 Encounters:  02/04/13 5\' 10"  (1.778 m)    Weight: Wt Readings from Last 1 Encounters:  02/04/13 155 lb 13.8 oz (70.7 kg)    Ideal Body Weight: 166#, (75.4 kg)  % Ideal Body Weight: 94%  Wt Readings from Last 10 Encounters:  02/04/13 155 lb 13.8 oz (70.7 kg)  01/25/13 157 lb 1.9 oz (71.269 kg)  01/12/13 158 lb (71.668 kg)  01/10/13 160 lb (72.576 kg)    Usual Body Weight: 160#  % Usual Body  Weight: 98%  BMI:  Body mass index is 22.36 kg/(m^2).normal range  Estimated Nutritional Needs: Kcal: 1800-2130 Protein: 80-90 gr Fluid: 2100 ml/day  Skin: tear to left upper arm  Diet Order: General  EDUCATION NEEDS: -No education needs identified at this time   Intake/Output Summary (Last 24 hours) at 02/05/13 0931 Last data filed at 02/05/13 0835  Gross per 24 hour  Intake    120 ml  Output      0 ml  Net    120 ml    Last BM: 02/03/13   Labs:   Recent Labs Lab 02/04/13 1238 02/05/13 0615  NA 136 134*  K 4.0 3.1*  CL 95* 95*  CO2 28 30  BUN 11 11  CREATININE 0.80 0.69  CALCIUM 10.2 9.6  GLUCOSE 100* 104*    CBG (last 3)  No results found for this basename: GLUCAP,  in the last 72 hours  Scheduled Meds: . allopurinol  100 mg Oral Daily  . amLODipine  5 mg Oral Daily  . escitalopram  10 mg Oral Daily  . famotidine  20 mg Oral BID  . potassium chloride  40 mEq Oral TID  . Rivaroxaban  15 mg Oral BID WC  . simvastatin  20 mg Oral q1800  . sodium chloride  3 mL Intravenous Q12H  . sodium chloride  3 mL Intravenous Q12H    Continuous Infusions:   Past Medical History  Diagnosis Date  . Essential hypertension, benign   . Hypercholesteremia   . Depression   .  Acid reflux   . Gout   . Prostate cancer     Past Surgical History  Procedure Laterality Date  . Fracture surgery    . Hemorrhoid surgery    . Hernia repair    . Prostate surgery      prostate seed implantation  . Left elbow surgery      tendon repair?  . Tonsillectomy      Royann Shivers MS,RD,LDN,CSG Office: [phone] Pager: [phone]

## 2013-02-05 NOTE — Progress Notes (Signed)
[facility] PROGRESS NOTE  Jonathon Conway ZOX:[id number] DOB: 1931/10/21 DOA: 02/04/2013 PCP: Josue Hector, MD  Assessment/Plan: 1. Acute RML pulmonary embolus: with associated chest pain, alveolar hemorrhage versus early parenchymal infarct peripherally. No hypoxia or arrhythmias. Continue Xarelto. One episode of very small volume hemoptysis.  2. RUL spiculate nodule: concerning for primary bronchogenic carcinoma (adenocarcinoma favored). Recommend further evaluation with PET CT as outpatient. This was not discussed with the patient at this point. 3. Linear 5 mm metallic fragment within the parenchyma of the right middle lobe: Highly suspicious for embolized prostate seed. This fragment is approximately 4 cm distal to the same area of the pulmonary embolus and is not felt to be related to her discussion with radiology. Followup as an outpatient for further recommendations. 4. Multivessel coronary artery disease seen on chest CT, also history of abnormal myocardial perfusion study. Followup with cardiology as an outpatient. Remains asymptomatic. He is allergic to aspirin  5. GERD: Poorly controlled with daily symptoms. Continue H2 blocker. 6. Hypertension   Continue Xarelto. Monitor for hemoptysis.  Liberalize activity  Check bilateral lower extremity venous Dopplers  Likely home 8/24  Pending studies:   Bilateral lower extremity venous Dopplers  Code Status: full code DVT prophylaxis: Xarelto Family Communication: none present Disposition Plan: home when improved  Brendia Sacks, MD  [facility]  Pager [phone] If 7PM-7AM, please contact night-coverage at [email or web address], password [facility] 02/05/2013, 9:29 AM  LOS: 1 day   Clinical Summary: 79 year old man presented to the emergency department with complaint of right-sided chest pain. Initial evaluation was notable for positive stable EKG, negative troponin but d-dimer positive. Subsequent chest CT revealed  right-sided pulmonary embolism.  Consultants:  none  Procedures:  none  HPI/Subjective: Still has right-sided chest pain, medication has been effective for pain relief. Still has pleuritic pain and difficulty taking a deep breath. Had one episode of a very small amount of blood mixed with sputum.  Objective: Filed Vitals:   02/04/13 1700 02/04/13 1752 02/04/13 2215 02/05/13 0647  BP: 150/77 168/85 121/73 119/64  Pulse: 76 81 67 63  Temp:  98.2 F (36.8 C) 98.5 F (36.9 C) 97.8 F (36.6 C)  TempSrc:  Oral Oral Oral  Resp: 13 18 17 16   Height:  5\' 10"  (1.778 m)    Weight:  70.7 kg (155 lb 13.8 oz)    SpO2:  92% 90% 93%    Intake/Output Summary (Last 24 hours) at 02/05/13 0929 Last data filed at 02/05/13 0835  Gross per 24 hour  Intake    120 ml  Output      0 ml  Net    120 ml     Filed Weights   02/04/13 1218 02/04/13 1752  Weight: 71.668 kg (158 lb) 70.7 kg (155 lb 13.8 oz)    Exam:   Afebrile, vital signs stable. No hypoxia.  Telemetry: Sinus rhythm. No arrhythmias.  General: Appears calm and comfortable. Speech fluent and clear. Heart appearing.  Cardiovascular: Regular rate and rhythm. No murmur, rub, gallop. No lower extremity edema.  Respiratory: Clear to auscultation bilaterally. No wheezes, rales, rhonchi. Normal respiratory effort.  Psychiatric: Grossly normal mood and affect. Speech fluent and appropriate.  Data Reviewed:  Potassium 3.1.  CBC unremarkable  Scheduled Meds: . allopurinol  100 mg Oral Daily  . amLODipine  5 mg Oral Daily  . escitalopram  10 mg Oral Daily  . famotidine  20 mg Oral BID  . potassium chloride  40 mEq Oral TID  .  Rivaroxaban  15 mg Oral BID WC  . simvastatin  20 mg Oral q1800  . sodium chloride  3 mL Intravenous Q12H  . sodium chloride  3 mL Intravenous Q12H   Continuous Infusions:   Principal Problem:   Acute pulmonary embolism Active Problems:   GERD (gastroesophageal reflux disease)   Abnormal  myocardial perfusion study   Coronary artery disease   Time spent 15 minutes

## 2013-02-06 DIAGNOSIS — R319 Hematuria, unspecified: Secondary | ICD-10-CM

## 2013-02-06 LAB — URINALYSIS, ROUTINE W REFLEX MICROSCOPIC
Bilirubin Urine: NEGATIVE
Glucose, UA: NEGATIVE mg/dL
Protein, ur: 100 mg/dL — AB
Specific Gravity, Urine: 1.025 (ref 1.005–1.030)
Urobilinogen, UA: 1 mg/dL (ref 0.0–1.0)

## 2013-02-06 LAB — URINE MICROSCOPIC-ADD ON

## 2013-02-06 MED ORDER — POLYETHYLENE GLYCOL 3350 17 G PO PACK
17.0000 g | PACK | Freq: Every day | ORAL | Status: DC
  Administered 2013-02-06 – 2013-02-07 (×2): 17 g via ORAL
  Filled 2013-02-06 (×2): qty 1

## 2013-02-06 MED ORDER — DOCUSATE SODIUM 100 MG PO CAPS
100.0000 mg | ORAL_CAPSULE | Freq: Two times a day (BID) | ORAL | Status: DC
  Administered 2013-02-06 – 2013-02-07 (×3): 100 mg via ORAL
  Filled 2013-02-06 (×3): qty 1

## 2013-02-06 NOTE — Progress Notes (Signed)
[facility] PROGRESS NOTE  Jonathon Conway ZOX:[id number] DOB: March 12, 1932 DOA: 02/04/2013 PCP: Josue Hector, MD  Assessment/Plan: 1. Acute RML pulmonary embolus: with associated chest pain, alveolar hemorrhage versus early parenchymal infarct peripherally. Remained stable. No hypoxia or arrhythmias. Continue Xarelto. One episode of very small volume hemoptysis without recurrence.  2. Hematuria: No history of instrumentation or previous history as an outpatient. Check urinalysis. Presumably would be related to Xarelto. Monitor as below. 3. RUL spiculate nodule: concerning for primary bronchogenic carcinoma (adenocarcinoma favored). Recommend further evaluation with PET CT as outpatient. This was not discussed with the patient at this point. 4. Linear 5 mm metallic fragment within the parenchyma of the right middle lobe: Highly suspicious for embolized prostate seed. This fragment is approximately 4 cm distal to the same area of the pulmonary embolus and is not felt to be related to her discussion with radiology. Followup as an outpatient for further recommendations. 5. Multivessel coronary artery disease seen on chest CT, also history of abnormal myocardial perfusion study. Followup with cardiology as an outpatient. Remains asymptomatic. He is allergic to aspirin  6. GERD: Poorly controlled with daily symptoms. Continue H2 blocker. 7. Hypertension: Stable.   Continue Xarelto. Monitor for resolution of hematuria.  CBC in the morning  Check urinalysis  Pending studies:   u/a  Code Status: full code DVT prophylaxis: Xarelto Family Communication: none present Disposition Plan: home when improved  Jonathon Sacks, MD  [facility]  Pager [phone] If 7PM-7AM, please contact night-coverage at [email or web address], password [facility] 02/06/2013, 10:54 AM  LOS: 2 days   Clinical Summary: 79 year old man presented to the emergency department with complaint of right-sided chest pain.  Initial evaluation was notable for positive stable EKG, negative troponin but d-dimer positive. Subsequent chest CT revealed right-sided pulmonary embolism.  Consultants:  none  Procedures:  none  HPI/Subjective: Has noted some hematuria yesterday and some again this morning. Painless. Still has right-sided chest pain with deep inspiration. Breathing okay.  Objective: Filed Vitals:   02/05/13 0647 02/05/13 1327 02/05/13 2100 02/06/13 0500  BP: 119/64 119/59 124/65 126/69  Pulse: 63 72 66 70  Temp: 97.8 F (36.6 C) 98.3 F (36.8 C) 98.4 F (36.9 C) 97.5 F (36.4 C)  TempSrc: Oral Oral Oral Oral  Resp: 16 19 20 17   Height:      Weight:      SpO2: 93% 96% 92% 98%    Intake/Output Summary (Last 24 hours) at 02/06/13 1054 Last data filed at 02/06/13 0846  Gross per 24 hour  Intake    960 ml  Output      0 ml  Net    960 ml     Filed Weights   02/04/13 1218 02/04/13 1752  Weight: 71.668 kg (158 lb) 70.7 kg (155 lb 13.8 oz)    Exam:   Afebrile, vital signs stable. No hypoxia.   general: Appears calm and comfortable. Speech fluent and clear.  Cardiovascular: Regular rate and rhythm. No murmur, rub, gallop. No lower extremity edema.  Telemetry: Sinus rhythm.  Respiratory: Clear to auscultation bilaterally. No wheezes, rales, rhonchi. Normal respiratory effort.  Data Reviewed:  Bilateral lower extremity venous Dopplers negative  Scheduled Meds: . allopurinol  100 mg Oral Daily  . amLODipine  5 mg Oral Daily  . escitalopram  10 mg Oral Daily  . famotidine  20 mg Oral BID  . feeding supplement  1 Container Oral Q2000  . Rivaroxaban  15 mg Oral BID WC  . simvastatin  20 mg Oral q1800  . sodium chloride  3 mL Intravenous Q12H  . sodium chloride  3 mL Intravenous Q12H   Continuous Infusions:   Principal Problem:   Acute pulmonary embolism Active Problems:   GERD (gastroesophageal reflux disease)   Abnormal myocardial perfusion study   Coronary artery  disease   Hematuria   Time spent 15 minutes

## 2013-02-07 ENCOUNTER — Ambulatory Visit: Payer: Medicare Other | Admitting: Cardiology

## 2013-02-07 LAB — CBC
Hemoglobin: 13.3 g/dL (ref 13.0–17.0)
MCH: 34.7 pg — ABNORMAL HIGH (ref 26.0–34.0)
MCHC: 33.8 g/dL (ref 30.0–36.0)
MCV: 102.6 fL — ABNORMAL HIGH (ref 78.0–100.0)
RBC: 3.83 MIL/uL — ABNORMAL LOW (ref 4.22–5.81)

## 2013-02-07 MED ORDER — RIVAROXABAN 15 MG PO TABS: 15.0000 mg | ORAL_TABLET | Freq: Two times a day (BID) | ORAL | Status: DC

## 2013-02-07 MED ORDER — RIVAROXABAN 20 MG PO TABS: 20.0000 mg | ORAL_TABLET | Freq: Every day | ORAL | Status: DC

## 2013-02-07 MED ORDER — HYDROCODONE-ACETAMINOPHEN 5-325 MG PO TABS: 1.0000 | ORAL_TABLET | Freq: Four times a day (QID) | ORAL | Status: DC | PRN

## 2013-02-07 NOTE — Discharge Summary (Signed)
Physician Discharge Summary  JAMEIS NEWSHAM [id number]:[id number] DOB: 1931/12/09 DOA: 02/04/2013  PCP: Josue Hector, MD Cardiologist: Nona Dell, MD  Admit date: 02/04/2013 Discharge date: 02/07/2013  Recommendations for Outpatient Follow-up:  1. Continue treatment for acute pulmonary embolism cause unclear. Continue Xarelto. 2. RUL spiculate nodule concerning for cancer. Outpatient PET CT recommended. I discussed the nodule with the patient and the need for followup but I did not characterize it as cancer at this point. 3. FB in right lung consistent with embolized prostate seed. PCP aware. Further evaluation as an outpatient as clinically indicated.  4. Multivessel coronary artery disease by CT chest.    Discharge Diagnoses:  1. Acute right middle lobe pulmonary embolism 2. Hematuria 3. Right upper lobe spiculated nodule 4. Right lung foreign body consistent with embolized prostate seed 5. Multivessel coronary artery disease seen on chest CT  Discharge Condition: Improved  Disposition: Home  Diet recommendation: Heart healthy  Filed Weights   02/04/13 1218 02/04/13 1752  Weight: 71.668 kg (158 lb) 70.7 kg (155 lb 13.8 oz)    History of present illness:  79 year old man presented to the emergency department with complaint of right-sided chest pain. Initial evaluation was notable for positive stable EKG, negative troponin but d-dimer positive. Subsequent chest CT revealed right-sided pulmonary embolism.  Hospital Course:  Mr. Cajuste was admitted for treatment of pulmonary embolism. Treatment modalities were discussed including Coumadin/Lovenox the patient elected to start Xarelto. No significant hemoptysis or complications were noted except for some mild hematuria which is resolving. No hypoxia or tachypnea. Pain is improved and he is now stable for discharge. Chest CT also had findings worrisome for cancer and did reveal known foreign body. Hospitalization was discussed with  the patient's primary care physician by telephone today. Message sent to the patient's primary cardiologist.  1. Acute RML pulmonary embolus: with associated chest pain, alveolar hemorrhage versus early parenchymal infarct peripherally. Cause unclear. Remains stable. No hypoxia or arrhythmias. Continue Xarelto. LE dopplers were negative. 2. Hematuria: Resolving spontaneously. No history of instrumentation or previous history as an outpatient. Monitor as outpatient. 3. RUL spiculate nodule: concerning for primary bronchogenic carcinoma (adenocarcinoma favored). Recommend further evaluation with PET CT as outpatient.  4. Linear 5 mm metallic fragment within the parenchyma of the right middle lobe: Highly suspicious for embolized prostate seed. This fragment is approximately 4 cm distal to the same area of the pulmonary embolus and is not felt to be related per discussion with radiology. Followup as an outpatient for further recommendations. 5. Multivessel coronary artery disease seen on chest CT, also history of abnormal myocardial perfusion study. Followup with cardiology as an outpatient. Remains asymptomatic. He is allergic to aspirin  6. GERD: Poorly controlled with daily symptoms. Continue H2 blocker. 7. Hypertension: Stable.  Consultants:  none Procedures:  none  Discharge Instructions  Discharge Orders   Future Orders Complete By Expires   Diet - low sodium heart healthy  As directed    Discharge instructions  As directed    Comments:     Continue Xarelto to treat a blood clot in your lungs. This medication is dosed at 15mg  two times a day for the first 21 days, then 20mg  once daily thereafter. The main side effect is bleeding. Call your physician or seek immediate medical attention for bleeding, increased pain, chest pain or shortness of breath.  Further testing is needed for a nodule seen in your lung. This is a special scan that can only be done as an outpatient  and has been discussed  with your primary care physician.   Increase activity slowly  As directed        Medication List    STOP taking these medications       clopidogrel 75 MG tablet  Commonly known as:  PLAVIX      TAKE these medications       allopurinol 100 MG tablet  Commonly known as:  ZYLOPRIM  Take 100 mg by mouth daily.     amLODipine 5 MG tablet  Commonly known as:  NORVASC  Take 5 mg by mouth daily.     escitalopram 10 MG tablet  Commonly known as:  LEXAPRO  Take 10 mg by mouth daily.     HYDROcodone-acetaminophen 5-325 MG per tablet  Commonly known as:  NORCO/VICODIN  Take 1 tablet by mouth every 6 (six) hours as needed for pain.     multivitamin tablet  Take 1 tablet by mouth daily.     nitroGLYCERIN 0.4 MG SL tablet  Commonly known as:  NITROSTAT  Place 1 tablet (0.4 mg total) under the tongue every 5 (five) minutes as needed for chest pain. Up to 3 doses. If no relief after 3rd dose, proceed to ED for evaluation.     pravastatin 40 MG tablet  Commonly known as:  PRAVACHOL  Take 40 mg by mouth at bedtime.     pyridOXINE 100 MG tablet  Commonly known as:  VITAMIN B-6  Take 100 mg by mouth daily.     ranitidine 150 MG tablet  Commonly known as:  ZANTAC  Take 150 mg by mouth 2 (two) times daily.     Rivaroxaban 20 MG Tabs tablet  Commonly known as:  XARELTO  Take 1 tablet (20 mg total) by mouth daily. Start September 13th after you complete a total of 21 day of the 15 mg dose.     Rivaroxaban 15 MG Tabs tablet  Commonly known as:  XARELTO  Take 1 tablet (15 mg total) by mouth 2 (two) times daily with a meal.     vitamin E 400 UNIT capsule  Take 400 Units by mouth daily.       Allergies  Allergen Reactions  . Asa [Aspirin] Anaphylaxis    The results of significant diagnostics from this hospitalization (including imaging, microbiology, ancillary and laboratory) are listed below for reference.    Significant Diagnostic Studies: Ct Angio Chest Pe W/cm &/or Wo  Cm  02/04/2013   *RADIOLOGY REPORT*  Clinical Data: Chest pain  CT ANGIOGRAPHY CHEST  Technique:  Multidetector CT imaging of the chest using the standard protocol during bolus administration of intravenous contrast. Multiplanar reconstructed images including MIPs were obtained and reviewed to evaluate the vascular anatomy.  Contrast: OMNIPAQUE IOHEXOL 350 MG/ML SOLN  Comparison: Chest x-ray obtained earlier today at 12:33 p.m.  Findings:  Mediastinum: Unremarkable CT appearance of the thyroid gland.  No suspicious mediastinal or hilar adenopathy.   An 8 mm inferior right hilar lymph node is not enlarged by CT criteria.  No soft tissue mediastinal mass.  The thoracic esophagus is unremarkable.  Heart/Vascular: Adequate opacification of the pulmonary arteries to the proximal subsegmental level.   Filling defect within the right middle lobar pulmonary artery extending into segmental and subsegmental branches.  There is associated peripheral ground-glass attenuation opacity extending to the pleural surface consistent with alveolar hemorrhage versus parenchymal infarct.  The atherosclerotic calcifications are identified in the left main, and left anterior descending coronary arteries.  The heart is within normal limits for size.  There is no pericardial effusion. Conventional three-vessel aortic arch.  Atherosclerotic calcifications noted throughout the transverse aorta.  No aneurysmal dilatation or acute dissection.  Lungs/Pleura: Irregular spiculated pulmonary nodule in the right upper lobe demonstrates a faint peripheral ground-glass attenuation halo.  The overall size of the nodular opacity including of the subsolid component measures 1.7 cm.  The solid component measures up to 1.1 cm.  A linear 5 mm metallic fragment is noted within the right middle lobe.  This was seen on the prior chest x-ray from 01/10/2013 was not present on earlier imaging. Dependent atelectasis in the bilateral lower lobes.  Upper  Abdomen: Limited imaging of the upper abdomen is unremarkable.  Bones: No acute fracture or aggressive appearing lytic or blastic osseous lesion.  IMPRESSION:  1.  Positive for acute right middle lobe are pulmonary embolus with associated alveolar hemorrhage versus early parenchymal infarct peripherally.  2.  Solid and semisolid 1.7 cm spiculated right upper lobe pulmonary nodule highly concerning for primary bronchogenic carcinoma (adenocarcinoma favored).  Recommend further evaluation with PET CT.  3.  Linear 5 mm metallic fragment within the parenchyma of the right middle lobe which is seen on prior chest x-rays from earlier today and 01/10/2013.  This may have been aspirated, or potentially embolized.  4.  Atherosclerosis including multivessel coronary artery disease.  Critical Value/emergent results were called by telephone at the time of interpretation on 02/04/2013 at 03:50 p.m. to Dr. Estell Harpin, who verbally acknowledged these results.   Original Report Authenticated By: Malachy Moan, M.D.   US Venous Img Lower Bilateral  02/05/2013   *RADIOLOGY REPORT*  Clinical Data: Acute pulmonary emboli.  Prostate carcinoma.  BILATERAL LOWER EXTREMITY VENOUS DOPPLER ULTRASOUND  Technique: Gray-scale sonography with compression, as well as color and duplex ultrasound, were performed to evaluate the deep venous system from the level of the common femoral vein through the popliteal and proximal calf veins.  Comparison: None  Findings:  Normal compressibility of  the common femoral, superficial femoral, and popliteal veins, as well as the proximal calf veins.  No filling defects to suggest DVT on grayscale or color Doppler imaging.  Doppler waveforms show normal direction of venous flow, normal respiratory phasicity and response to augmentation.  IMPRESSION: No evidence of  lower extremity deep vein thrombosis.   Original Report Authenticated By: D. Andria Rhein, MD   Dg Chest Port 1 View  02/04/2013   *RADIOLOGY  REPORT*  Clinical Data: Chest pain  PORTABLE CHEST - 1 VIEW  Comparison: 01/10/2013  Findings: The cardiac shadow is stable.  The lungs are clear bilaterally.  The previously seen radiopaque density over the right lung base is again seen.  IMPRESSION: No acute abnormality noted.   Original Report Authenticated By: Alcide Clever, M.D.    Labs: Basic Metabolic Panel:  Recent Labs Lab 02/04/13 1238 02/05/13 0615  NA 136 134*  K 4.0 3.1*  CL 95* 95*  CO2 28 30  GLUCOSE 100* 104*  BUN 11 11  CREATININE 0.80 0.69  CALCIUM 10.2 9.6   CBC:  Recent Labs Lab 02/04/13 1238 02/05/13 0615 02/07/13 0613  WBC 10.5 9.9 8.2  HGB 14.9 14.0 13.3  HCT 43.3 40.6 39.3  MCV 100.0 101.2* 102.6*  PLT 210 206 229   Cardiac Enzymes:  Recent Labs Lab 02/04/13 1238  TROPONINI <0.30     Recent Labs  02/04/13 1238  PROBNP 153.1    Principal Problem:   Acute  pulmonary embolism Active Problems:   GERD (gastroesophageal reflux disease)   Abnormal myocardial perfusion study   Coronary artery disease   Hematuria   Time coordinating discharge: 35 minutes  Signed:  Brendia Sacks, MD [facility] 02/07/2013, 12:13 PM

## 2013-02-07 NOTE — Care Management Note (Signed)
    Page 1 of 1   02/07/2013     12:02:34 PM   CARE MANAGEMENT NOTE 02/07/2013  Patient:  Jonathon Conway, Jonathon Conway   Account Number:  [id number]  Date Initiated:  02/07/2013  Documentation initiated by:  Rosemary Holms  Subjective/Objective Assessment:   Pt admitted from home. Has family to assist him. No HH DME needs identified. DC home today     Action/Plan:   Anticipated DC Date:  02/07/2013   Anticipated DC Plan:  HOME/SELF CARE      DC Planning Services  CM consult      Choice offered to / List presented to:             Status of service:  Completed, signed off Medicare Important Message given?  YES (If response is "NO", the following Medicare IM given date fields will be blank) Date Medicare IM given:  02/07/2013 Date Additional Medicare IM given:    Discharge Disposition:    Per UR Regulation:    If discussed at Long Length of Stay Meetings, dates discussed:    Comments:  02/07/13 Rosemary Holms RN BSN CM

## 2013-02-07 NOTE — Progress Notes (Signed)
[facility] PROGRESS NOTE  Jonathon Conway:[id number] DOB: [birth year] DOA: 02/04/2013 PCP: Josue Hector, MD  Assessment/Plan: 1. Acute RML pulmonary embolus: with associated chest pain, alveolar hemorrhage versus early parenchymal infarct peripherally. Cause unclear. Remains stable. No hypoxia or arrhythmias. Continue Xarelto. LE dopplers were negative. 2. Hematuria: Resolving. No history of instrumentation or previous history as an outpatient. Monitor as outpatient. 3. RUL spiculate nodule: concerning for primary bronchogenic carcinoma (adenocarcinoma favored). Recommend further evaluation with PET CT as outpatient.  4. Linear 5 mm metallic fragment within the parenchyma of the right middle lobe: Highly suspicious for embolized prostate seed. This fragment is approximately 4 cm distal to the same area of the pulmonary embolus and is not felt to be related per discussion with radiology. Followup as an outpatient for further recommendations. 5. Multivessel coronary artery disease seen on chest CT, also history of abnormal myocardial perfusion study. Followup with cardiology as an outpatient. Remains asymptomatic. He is allergic to aspirin  6. GERD: Poorly controlled with daily symptoms. Continue H2 blocker. 7. Hypertension: Stable.   Continue Xarelto 15mg  BID through 9/12, then 9/13 start 20mg  daily . Hematuria spontaneously resolving.  Remove IV.  Discharge home today  Pending studies:   Urine culture  Code Status: full code DVT prophylaxis: Xarelto Family Communication: none present Disposition Plan: home when improved  Brendia Sacks, MD  [facility]  Pager [phone] If 7PM-7AM, please contact night-coverage at [email or web address], password [facility] 02/07/2013, 11:47 AM  LOS: 3 days   Clinical Summary: 79 year old man presented to the emergency department with complaint of right-sided chest pain. Initial evaluation was notable for positive stable EKG, negative  troponin but d-dimer positive. Subsequent chest CT revealed right-sided pulmonary embolism.  Consultants:  none  Procedures:  none  HPI/Subjective: He feels much better today. Hematuria has nearly resolved. Chest pain much improved. Breathing better. He noticed some swelling of his right arm at the IV site, no pain.  Objective: Filed Vitals:   02/06/13 0500 02/06/13 1550 02/06/13 2100 02/07/13 0652  BP: 126/69 134/55 125/78 137/63  Pulse: 70 75 68 63  Temp: 97.5 F (36.4 C) 97.8 F (36.6 C) 98.2 F (36.8 C) 97.5 F (36.4 C)  TempSrc: Oral Oral Oral Oral  Resp: 17 18 20 20   Height:      Weight:      SpO2: 98% 98% 91% 96%    Intake/Output Summary (Last 24 hours) at 02/07/13 1147 Last data filed at 02/07/13 0900  Gross per 24 hour  Intake   1200 ml  Output      0 ml  Net   1200 ml     Filed Weights   02/04/13 1218 02/04/13 1752  Weight: 71.668 kg (158 lb) 70.7 kg (155 lb 13.8 oz)    Exam:   Afebrile, vital signs stable. No hypoxia.  General: He appears calm and comfortable.  Cardiovascular: Regular rate and rhythm. No murmur, rub, gallop.  Respiratory: Clear to auscultation bilaterally. No wheezes, rales, rhonchi. Normal respiratory effort.  Right arm: Some induration right arm near the IV site. No significant erythema, fluctuance or tenderness. No evidence of infection.  Data Reviewed:  Hemoglobin stable, consistent with value 12/2012.  Urinalysis revealed hematuria  Scheduled Meds: . allopurinol  100 mg Oral Daily  . amLODipine  5 mg Oral Daily  . docusate sodium  100 mg Oral BID  . escitalopram  10 mg Oral Daily  . famotidine  20 mg Oral BID  . feeding supplement  1 Container  Oral Q2000  . polyethylene glycol  17 g Oral Daily  . rivaroxaban  15 mg Oral BID WC  . simvastatin  20 mg Oral q1800  . sodium chloride  3 mL Intravenous Q12H  . sodium chloride  3 mL Intravenous Q12H   Continuous Infusions:   Principal Problem:   Acute pulmonary  embolism Active Problems:   GERD (gastroesophageal reflux disease)   Abnormal myocardial perfusion study   Coronary artery disease   Hematuria

## 2013-02-07 NOTE — Progress Notes (Signed)
UR Chart Review Completed  

## 2013-02-07 NOTE — Progress Notes (Signed)
Discussed discharge instructions, patient verbalized understanding. Pt previous IV site was clean, dry and intact. Patient taken out via wheelchair in no acute distress.

## 2013-02-08 LAB — URINE CULTURE: Colony Count: NO GROWTH

## 2013-02-10 ENCOUNTER — Other Ambulatory Visit (HOSPITAL_COMMUNITY): Payer: Self-pay | Admitting: Family Medicine

## 2013-02-10 DIAGNOSIS — R911 Solitary pulmonary nodule: Secondary | ICD-10-CM

## 2013-02-11 ENCOUNTER — Encounter (HOSPITAL_COMMUNITY): Admit: 2013-02-11 | Payer: Medicare Other

## 2013-02-11 ENCOUNTER — Encounter (HOSPITAL_COMMUNITY): Payer: Self-pay

## 2013-02-11 ENCOUNTER — Encounter (HOSPITAL_COMMUNITY)
Admission: RE | Admit: 2013-02-11 | Discharge: 2013-02-11 | Disposition: A | Discharge status: Home / Self Care | Payer: Medicare Other | Source: Ambulatory Visit | Attending: Family Medicine | Admitting: Family Medicine

## 2013-02-11 DIAGNOSIS — I2510 Atherosclerotic heart disease of native coronary artery without angina pectoris: Secondary | ICD-10-CM | POA: Insufficient documentation

## 2013-02-11 DIAGNOSIS — K4090 Unilateral inguinal hernia, without obstruction or gangrene, not specified as recurrent: Secondary | ICD-10-CM | POA: Insufficient documentation

## 2013-02-11 DIAGNOSIS — N281 Cyst of kidney, acquired: Secondary | ICD-10-CM | POA: Insufficient documentation

## 2013-02-11 DIAGNOSIS — I728 Aneurysm of other specified arteries: Secondary | ICD-10-CM | POA: Insufficient documentation

## 2013-02-11 DIAGNOSIS — R911 Solitary pulmonary nodule: Secondary | ICD-10-CM | POA: Insufficient documentation

## 2013-02-11 DIAGNOSIS — I722 Aneurysm of renal artery: Secondary | ICD-10-CM | POA: Insufficient documentation

## 2013-02-11 DIAGNOSIS — N200 Calculus of kidney: Secondary | ICD-10-CM | POA: Insufficient documentation

## 2013-02-11 MED ORDER — FLUDEOXYGLUCOSE F - 18 (FDG) INJECTION
19.9000 | Freq: Once | INTRAVENOUS | Status: AC | PRN
  Administered 2013-02-11: 19.9 via INTRAVENOUS

## 2013-03-03 NOTE — Progress Notes (Signed)
Discussed PET scan results with Dr. Carolynn Comment is aware and has referred the patient for further evaluation to [facility]  Brendia Sacks, MD [facility] [phone]

## 2013-03-11 ENCOUNTER — Ambulatory Visit (HOSPITAL_COMMUNITY): Payer: Medicare Other

## 2013-03-11 ENCOUNTER — Other Ambulatory Visit (HOSPITAL_COMMUNITY): Payer: Self-pay | Admitting: Family Medicine

## 2013-03-11 DIAGNOSIS — R600 Localized edema: Secondary | ICD-10-CM

## 2013-03-11 DIAGNOSIS — M25562 Pain in left knee: Secondary | ICD-10-CM

## 2013-03-14 DIAGNOSIS — M7989 Other specified soft tissue disorders: Secondary | ICD-10-CM

## 2013-03-14 DIAGNOSIS — M79609 Pain in unspecified limb: Secondary | ICD-10-CM

## 2013-03-15 ENCOUNTER — Ambulatory Visit (HOSPITAL_COMMUNITY)
Admission: RE | Admit: 2013-03-15 | Discharge: 2013-03-15 | Disposition: A | Discharge status: Home / Self Care | Payer: Medicare Other | Source: Ambulatory Visit | Attending: Family Medicine | Admitting: Family Medicine

## 2013-03-15 DIAGNOSIS — R600 Localized edema: Secondary | ICD-10-CM

## 2013-03-15 DIAGNOSIS — M7989 Other specified soft tissue disorders: Secondary | ICD-10-CM | POA: Insufficient documentation

## 2013-03-15 DIAGNOSIS — M25562 Pain in left knee: Secondary | ICD-10-CM

## 2013-03-15 DIAGNOSIS — Z86711 Personal history of pulmonary embolism: Secondary | ICD-10-CM | POA: Insufficient documentation

## 2013-03-15 DIAGNOSIS — M79609 Pain in unspecified limb: Secondary | ICD-10-CM | POA: Insufficient documentation

## 2013-03-15 NOTE — Progress Notes (Signed)
VASCULAR LAB PRELIMINARY  PRELIMINARY  PRELIMINARY  PRELIMINARY  Left lower extremity venous duplex completed.    Preliminary report:  Left:  No evidence of DVT, superficial thrombosis, or Baker's cyst.  Jonathon Conway, RVS 03/15/2013, 4:08 PM

## 2013-04-15 ENCOUNTER — Other Ambulatory Visit (HOSPITAL_COMMUNITY): Payer: Self-pay | Admitting: *Deleted

## 2013-04-15 DIAGNOSIS — R911 Solitary pulmonary nodule: Secondary | ICD-10-CM

## 2013-04-25 ENCOUNTER — Encounter (HOSPITAL_COMMUNITY): Payer: Self-pay

## 2013-04-25 ENCOUNTER — Ambulatory Visit (HOSPITAL_COMMUNITY)
Admission: RE | Admit: 2013-04-25 | Discharge: 2013-04-25 | Disposition: A | Discharge status: Home / Self Care | Payer: Medicare Other | Source: Ambulatory Visit | Attending: Family Medicine | Admitting: Family Medicine

## 2013-04-25 DIAGNOSIS — I728 Aneurysm of other specified arteries: Secondary | ICD-10-CM | POA: Insufficient documentation

## 2013-04-25 DIAGNOSIS — K4090 Unilateral inguinal hernia, without obstruction or gangrene, not specified as recurrent: Secondary | ICD-10-CM | POA: Insufficient documentation

## 2013-04-25 DIAGNOSIS — I2510 Atherosclerotic heart disease of native coronary artery without angina pectoris: Secondary | ICD-10-CM | POA: Insufficient documentation

## 2013-04-25 DIAGNOSIS — I722 Aneurysm of renal artery: Secondary | ICD-10-CM | POA: Insufficient documentation

## 2013-04-25 DIAGNOSIS — J984 Other disorders of lung: Secondary | ICD-10-CM | POA: Insufficient documentation

## 2013-04-25 DIAGNOSIS — R911 Solitary pulmonary nodule: Secondary | ICD-10-CM | POA: Insufficient documentation

## 2013-04-25 DIAGNOSIS — K7689 Other specified diseases of liver: Secondary | ICD-10-CM | POA: Insufficient documentation

## 2013-04-25 DIAGNOSIS — N200 Calculus of kidney: Secondary | ICD-10-CM | POA: Insufficient documentation

## 2013-04-25 DIAGNOSIS — I7090 Unspecified atherosclerosis: Secondary | ICD-10-CM | POA: Insufficient documentation

## 2013-04-25 MED ORDER — FLUDEOXYGLUCOSE F - 18 (FDG) INJECTION
18.1000 | Freq: Once | INTRAVENOUS | Status: AC | PRN
  Administered 2013-04-25: 18.1 via INTRAVENOUS

## 2013-05-08 ENCOUNTER — Encounter (HOSPITAL_COMMUNITY): Payer: Self-pay | Admitting: Emergency Medicine

## 2013-05-08 ENCOUNTER — Emergency Department (HOSPITAL_COMMUNITY): Payer: Medicare Other

## 2013-05-08 ENCOUNTER — Emergency Department (HOSPITAL_COMMUNITY)
Admission: EM | Admit: 2013-05-08 | Discharge: 2013-05-08 | Disposition: A | Discharge status: Home / Self Care | Payer: Medicare Other | Attending: Emergency Medicine | Admitting: Emergency Medicine

## 2013-05-08 DIAGNOSIS — Z86711 Personal history of pulmonary embolism: Secondary | ICD-10-CM | POA: Insufficient documentation

## 2013-05-08 DIAGNOSIS — R0789 Other chest pain: Secondary | ICD-10-CM

## 2013-05-08 DIAGNOSIS — Y929 Unspecified place or not applicable: Secondary | ICD-10-CM | POA: Insufficient documentation

## 2013-05-08 DIAGNOSIS — Z8546 Personal history of malignant neoplasm of prostate: Secondary | ICD-10-CM | POA: Insufficient documentation

## 2013-05-08 DIAGNOSIS — R319 Hematuria, unspecified: Secondary | ICD-10-CM | POA: Insufficient documentation

## 2013-05-08 DIAGNOSIS — F17200 Nicotine dependence, unspecified, uncomplicated: Secondary | ICD-10-CM | POA: Insufficient documentation

## 2013-05-08 DIAGNOSIS — Z79899 Other long term (current) drug therapy: Secondary | ICD-10-CM | POA: Insufficient documentation

## 2013-05-08 DIAGNOSIS — F329 Major depressive disorder, single episode, unspecified: Secondary | ICD-10-CM | POA: Insufficient documentation

## 2013-05-08 DIAGNOSIS — F3289 Other specified depressive episodes: Secondary | ICD-10-CM | POA: Insufficient documentation

## 2013-05-08 DIAGNOSIS — S20219A Contusion of unspecified front wall of thorax, initial encounter: Secondary | ICD-10-CM | POA: Insufficient documentation

## 2013-05-08 DIAGNOSIS — E7800 Pure hypercholesterolemia, unspecified: Secondary | ICD-10-CM | POA: Insufficient documentation

## 2013-05-08 DIAGNOSIS — R0602 Shortness of breath: Secondary | ICD-10-CM | POA: Insufficient documentation

## 2013-05-08 DIAGNOSIS — Z7901 Long term (current) use of anticoagulants: Secondary | ICD-10-CM | POA: Insufficient documentation

## 2013-05-08 DIAGNOSIS — X58XXXA Exposure to other specified factors, initial encounter: Secondary | ICD-10-CM | POA: Insufficient documentation

## 2013-05-08 DIAGNOSIS — I10 Essential (primary) hypertension: Secondary | ICD-10-CM | POA: Insufficient documentation

## 2013-05-08 DIAGNOSIS — M109 Gout, unspecified: Secondary | ICD-10-CM | POA: Insufficient documentation

## 2013-05-08 DIAGNOSIS — K219 Gastro-esophageal reflux disease without esophagitis: Secondary | ICD-10-CM | POA: Insufficient documentation

## 2013-05-08 DIAGNOSIS — Y939 Activity, unspecified: Secondary | ICD-10-CM | POA: Insufficient documentation

## 2013-05-08 LAB — CBC WITH DIFFERENTIAL/PLATELET
Eosinophils Absolute: 0.1 10*3/uL (ref 0.0–0.7)
Eosinophils Relative: 1 % (ref 0–5)
HCT: 40.9 % (ref 39.0–52.0)
Lymphs Abs: 3.3 10*3/uL (ref 0.7–4.0)
MCH: 35.8 pg — ABNORMAL HIGH (ref 26.0–34.0)
MCV: 102.3 fL — ABNORMAL HIGH (ref 78.0–100.0)
Monocytes Absolute: 0.8 10*3/uL (ref 0.1–1.0)
Platelets: 191 10*3/uL (ref 150–400)
RBC: 4 MIL/uL — ABNORMAL LOW (ref 4.22–5.81)
RDW: 13.4 % (ref 11.5–15.5)

## 2013-05-08 LAB — BASIC METABOLIC PANEL
BUN: 11 mg/dL (ref 6–23)
CO2: 29 mEq/L (ref 19–32)
Calcium: 9.5 mg/dL (ref 8.4–10.5)
Chloride: 93 mEq/L — ABNORMAL LOW (ref 96–112)
Creatinine, Ser: 0.66 mg/dL (ref 0.50–1.35)

## 2013-05-08 LAB — TROPONIN I: Troponin I: <0.3 ng/mL (ref <0.30)

## 2013-05-08 MED ORDER — SODIUM CHLORIDE 0.9 % IV SOLN
INTRAVENOUS | Status: DC
  Administered 2013-05-08: 17:00:00 via INTRAVENOUS

## 2013-05-08 MED ORDER — IOHEXOL 350 MG/ML SOLN
100.0000 mL | Freq: Once | INTRAVENOUS | Status: AC | PRN
  Administered 2013-05-08: 100 mL via INTRAVENOUS

## 2013-05-08 MED ORDER — SODIUM CHLORIDE 0.9 % IV BOLUS (SEPSIS)
250.0000 mL | Freq: Once | INTRAVENOUS | Status: AC
  Administered 2013-05-08: 250 mL via INTRAVENOUS

## 2013-05-08 MED ORDER — TRAMADOL HCL 50 MG PO TABS: 50.0000 mg | ORAL_TABLET | Freq: Four times a day (QID) | ORAL | Status: DC | PRN

## 2013-05-08 NOTE — ED Notes (Signed)
Pt c/o left side cp x 2-3 days. States he was dx with multiple PE's x 2 months ago. Pt states he took 2 hydrocodone and 3 shots of vodka before he came.

## 2013-05-08 NOTE — ED Provider Notes (Addendum)
CSN: [id number]     Arrival date & time 05/08/13  1542 History   First MD Initiated Contact with Patient 05/08/13 1609     Chief Complaint  Patient presents with  . Chest Pain   (Consider location/radiation/quality/duration/timing/severity/associated sxs/prior Treatment) The history is provided by the patient.   79 year old male followed by Dr. Lysbeth Galas in [place]. Patient with complaint of chest pain for 2-3 days. Left-sided bit worse with palpitation. Not associated with nausea or vomiting does have some mild shortness of breath. Patient has a history of pulmonary wasn't diagnosed about 2 months ago is on blood thinners. Patient concerned about recurrent blood clot. Patient's past medical history significant for hypertension high cholesterol no known coronary artery disease. As stated has had a pulmonary embolism in the past. Patient states that the pain currently is 1-2/10 at its worse it is maybe a 6/10. Nonradiating made worse by palpitation feels like a sharp pain. Patient is not aware of any injury to the chest.  Past Medical History  Diagnosis Date  . Essential hypertension, benign   . Hypercholesteremia   . Depression   . Acid reflux   . Gout   . Prostate cancer    Past Surgical History  Procedure Laterality Date  . Fracture surgery    . Hemorrhoid surgery    . Hernia repair    . Prostate surgery      prostate seed implantation  . Left elbow surgery      tendon repair?  . Tonsillectomy     Family History  Problem Relation Age of Onset  . Arrhythmia Mother     Pacemaker  . CAD Brother     CABG   History  Substance Use Topics  . Smoking status: Current Some Day Smoker    Types: Cigarettes  . Smokeless tobacco: Not on file  . Alcohol Use: 12.0 oz/week    24 drink(s) per week    Review of Systems  Constitutional: Negative for fever.  HENT: Negative for congestion.   Eyes: Negative for redness and visual disturbance.  Respiratory: Positive for  shortness of breath.   Cardiovascular: Positive for chest pain. Negative for leg swelling.  Gastrointestinal: Negative for nausea, vomiting and abdominal pain.  Genitourinary: Positive for hematuria.  Musculoskeletal: Negative for back pain.  Skin: Negative for rash.  Neurological: Negative for headaches.  Hematological: Bruises/bleeds easily.  Psychiatric/Behavioral: Negative for confusion.    Allergies  Asa  Home Medications   Current Outpatient Rx  Name  Route  Sig  Dispense  Refill  . allopurinol (ZYLOPRIM) 100 MG tablet   Oral   Take 100 mg by mouth daily.         Marland Kitchen amLODipine (NORVASC) 5 MG tablet   Oral   Take 5 mg by mouth daily.         Marland Kitchen escitalopram (LEXAPRO) 10 MG tablet   Oral   Take 10 mg by mouth daily.         Marland Kitchen esomeprazole (NEXIUM) 40 MG capsule   Oral   Take 40 mg by mouth daily at 12 noon.         Marland Kitchen HYDROcodone-acetaminophen (NORCO/VICODIN) 5-325 MG per tablet   Oral   Take 1 tablet by mouth every 6 (six) hours as needed for pain.   20 tablet   0   . Multiple Vitamin (MULTIVITAMIN) tablet   Oral   Take 1 tablet by mouth daily.         Marland Kitchen  pravastatin (PRAVACHOL) 40 MG tablet   Oral   Take 40 mg by mouth at bedtime.         . pyridOXINE (VITAMIN B-6) 100 MG tablet   Oral   Take 100 mg by mouth daily.         . Rivaroxaban (XARELTO) 20 MG TABS tablet   Oral   Take 20 mg by mouth daily with supper.         . vitamin E 400 UNIT capsule   Oral   Take 400 Units by mouth daily.         . nitroGLYCERIN (NITROSTAT) 0.4 MG SL tablet   Sublingual   Place 1 tablet (0.4 mg total) under the tongue every 5 (five) minutes as needed for chest pain. Up to 3 doses. If no relief after 3rd dose, proceed to ED for evaluation.   25 tablet   3   . traMADol (ULTRAM) 50 MG tablet   Oral   Take 1 tablet (50 mg total) by mouth every 6 (six) hours as needed.   20 tablet   0    BP 134/65  Pulse 74  Temp(Src) 98.2 F (36.8 C) (Oral)   Resp 16  SpO2 93% Physical Exam  Nursing note and vitals reviewed. Constitutional: He is oriented to person, place, and time. He appears well-developed and well-nourished. No distress.  HENT:  Head: Normocephalic and atraumatic.  Mouth/Throat: Oropharynx is clear and moist. No oropharyngeal exudate.  Eyes: Conjunctivae are normal. Pupils are equal, round, and reactive to light.  Neck: Normal range of motion. Neck supple.  Cardiovascular: Normal rate, regular rhythm and normal heart sounds.   No murmur heard. Pulmonary/Chest: Effort normal and breath sounds normal. No respiratory distress. He has no wheezes. He has no rales. He exhibits tenderness.  Patient's left chest with a area of bruising about 72-69 days old sort of yellow and purplish in color tender to palpation questionable small hematoma. No rib tenderness.  Abdominal: Soft. Bowel sounds are normal. There is no tenderness.  Musculoskeletal: Normal range of motion. He exhibits no edema.  Neurological: He is alert and oriented to person, place, and time. No cranial nerve deficit. He exhibits normal muscle tone. Coordination normal.  Skin: Skin is warm. No rash noted.    ED Course  Procedures (including critical care time) Labs Review Labs Reviewed  CBC WITH DIFFERENTIAL - Abnormal; Notable for the following:    RBC 4.00 (*)    MCV 102.3 (*)    MCH 35.8 (*)    All other components within normal limits  BASIC METABOLIC PANEL - Abnormal; Notable for the following:    Sodium 133 (*)    Potassium 3.2 (*)    Chloride 93 (*)    Glucose, Bld 121 (*)    GFR calc non Af Amer 90 (*)    All other components within normal limits  TROPONIN I   Results for orders placed during the hospital encounter of 05/08/13  CBC WITH DIFFERENTIAL      Result Value Range   WBC 7.4  4.0 - 10.5 K/uL   RBC 4.00 (*) 4.22 - 5.81 MIL/uL   Hemoglobin 14.3  13.0 - 17.0 g/dL   HCT 16.1  09.6 - 04.5 %   MCV 102.3 (*) 78.0 - 100.0 fL   MCH 35.8 (*) 26.0 -  34.0 pg   MCHC 35.0  30.0 - 36.0 g/dL   RDW 40.9  81.1 - 91.4 %   Platelets  191  150 - 400 K/uL   Neutrophils Relative % 43  43 - 77 %   Neutro Abs 3.1  1.7 - 7.7 K/uL   Lymphocytes Relative 45  12 - 46 %   Lymphs Abs 3.3  0.7 - 4.0 K/uL   Monocytes Relative 10  3 - 12 %   Monocytes Absolute 0.8  0.1 - 1.0 K/uL   Eosinophils Relative 1  0 - 5 %   Eosinophils Absolute 0.1  0.0 - 0.7 K/uL   Basophils Relative 1  0 - 1 %   Basophils Absolute 0.1  0.0 - 0.1 K/uL  TROPONIN I      Result Value Range   Troponin I <0.30  <0.30 ng/mL  BASIC METABOLIC PANEL      Result Value Range   Sodium 133 (*) 135 - 145 mEq/L   Potassium 3.2 (*) 3.5 - 5.1 mEq/L   Chloride 93 (*) 96 - 112 mEq/L   CO2 29  19 - 32 mEq/L   Glucose, Bld 121 (*) 70 - 99 mg/dL   BUN 11  6 - 23 mg/dL   Creatinine, Ser 1.61  0.50 - 1.35 mg/dL   Calcium 9.5  8.4 - 09.6 mg/dL   GFR calc non Af Amer 90 (*) >90 mL/min   GFR calc Af Amer >90  >90 mL/min    Imaging Review Ct Angio Chest Pe W/cm &/or Wo Cm  05/08/2013   CLINICAL DATA:  Chest pain.  Fever.  EXAM: CT ANGIOGRAPHY CHEST WITH CONTRAST  TECHNIQUE: Multidetector CT imaging of the chest was performed using the standard protocol during bolus administration of intravenous contrast. Multiplanar CT image reconstructions including MIPs were obtained to evaluate the vascular anatomy.  CONTRAST:  OMNIPAQUE IOHEXOL 350 MG/ML SOLN  COMPARISON:  PET CT 04/25/2013.  Prior chest CT 02/04/2013.  FINDINGS: No filling defects in the pulmonary arteries to suggest pulmonary emboli. Heart is normal size. Densely calcified coronary arteries. Aorta is normal caliber. No mediastinal, hilar, or axillary adenopathy.  Part solid and sub solid right upper lobe pulmonary nodule is again noted. The solid component measures 9 x 10 mm on image 20 to compared with 12 x 8 mm previously. The sub solid component measures approximately 16 x 10 mm compared with 19 x 12 mm previously.  No new pulmonary  nodules. Dependent atelectasis in the lower lungs bilaterally. No pleural effusions. No mediastinal, hilar, or axillary adenopathy. Chest wall soft tissues are unremarkable.  Imaging into the upper abdomen demonstrates diffuse fatty infiltration of the liver.  Review of the MIP images confirms the above findings.  IMPRESSION: No evidence of pulmonary embolus.  Part solid spiculated nodule in the right upper lobe. Overall size slightly smaller with compared to most recent PET CT.  Coronary artery disease with heavily calcified coronary arteries.  Diffuse fatty infiltration of the liver.   Electronically Signed   By: Charlett Nose M.D.   On: 05/08/2013 17:54   Dg Chest Portable 1 View  05/08/2013   CLINICAL DATA:  Left-sided chest pain for 2-3 days, recent history of pulmonary emboli  EXAM: PORTABLE CHEST - 1 VIEW  COMPARISON:  02/04/2013  FINDINGS: The heart size and mediastinal contours are within normal limits. Both lungs are clear. The visualized skeletal structures are unremarkable.  IMPRESSION: No active disease.   Electronically Signed   By: Esperanza Heir M.D.   On: 05/08/2013 16:19    EKG Interpretation    Date/Time:  Sunday May 08 2013 16:02:23 EST Ventricular Rate:  74 PR Interval:  152 QRS Duration: 106 QT Interval:  376 QTC Calculation: 417 R Axis:   -48 Text Interpretation:  Normal sinus rhythm Incomplete right bundle branch block Left anterior fascicular block Abnormal ECG When compared with ECG of 04-Feb-2013 12:20, No significant change was found Confirmed by Anela Bensman  MD, Nysa Sarin ([id number]) on 05/08/2013 6:11:02 PM            MDM   1. Chest wall pain    Patient with extensive workup for the chest pain. No evidence of pneumonia CT angios negative for recurrent pulmonary embolus. Also no evidence pulmonary edema or pneumothorax. Patient's troponin and EKG without any acute changes. No leukocytosis basic labs without significant abnormalities. Some mild hypo-bulimia.  Patient has a bruise over the area where the chest hurts suspect that there was an injury is not aware about possibly a small hematoma in that area we'll treat his chest wall pain. Patient also clinically talked about having blood in his urine occasionally does have followup with urology in the next 2 weeks he will let them be aware of that.    Shelda Jakes, MD 05/08/13 1806  Shelda Jakes, MD 05/08/13 1810  Shelda Jakes, MD 05/08/13 [phone]

## 2013-12-15 ENCOUNTER — Observation Stay (HOSPITAL_COMMUNITY): Payer: Medicare Other

## 2013-12-15 ENCOUNTER — Emergency Department (HOSPITAL_COMMUNITY): Payer: Medicare Other

## 2013-12-15 ENCOUNTER — Encounter (HOSPITAL_COMMUNITY): Payer: Self-pay | Admitting: Emergency Medicine

## 2013-12-15 ENCOUNTER — Inpatient Hospital Stay (HOSPITAL_COMMUNITY)
Admission: EM | Admit: 2013-12-15 | Discharge: 2013-12-20 | DRG: 478 | Disposition: A | Discharge status: Home / Self Care | Payer: Medicare Other | Attending: Internal Medicine | Admitting: Internal Medicine

## 2013-12-15 DIAGNOSIS — Z7901 Long term (current) use of anticoagulants: Secondary | ICD-10-CM

## 2013-12-15 DIAGNOSIS — R319 Hematuria, unspecified: Secondary | ICD-10-CM

## 2013-12-15 DIAGNOSIS — M549 Dorsalgia, unspecified: Secondary | ICD-10-CM | POA: Diagnosis present

## 2013-12-15 DIAGNOSIS — S32009A Unspecified fracture of unspecified lumbar vertebra, initial encounter for closed fracture: Secondary | ICD-10-CM | POA: Diagnosis present

## 2013-12-15 DIAGNOSIS — K219 Gastro-esophageal reflux disease without esophagitis: Secondary | ICD-10-CM | POA: Diagnosis present

## 2013-12-15 DIAGNOSIS — R0902 Hypoxemia: Secondary | ICD-10-CM | POA: Diagnosis present

## 2013-12-15 DIAGNOSIS — R911 Solitary pulmonary nodule: Secondary | ICD-10-CM | POA: Diagnosis present

## 2013-12-15 DIAGNOSIS — E7800 Pure hypercholesterolemia, unspecified: Secondary | ICD-10-CM | POA: Diagnosis present

## 2013-12-15 DIAGNOSIS — M545 Low back pain, unspecified: Secondary | ICD-10-CM

## 2013-12-15 DIAGNOSIS — F17200 Nicotine dependence, unspecified, uncomplicated: Secondary | ICD-10-CM | POA: Diagnosis present

## 2013-12-15 DIAGNOSIS — I10 Essential (primary) hypertension: Secondary | ICD-10-CM | POA: Diagnosis present

## 2013-12-15 DIAGNOSIS — R4182 Altered mental status, unspecified: Secondary | ICD-10-CM | POA: Diagnosis present

## 2013-12-15 DIAGNOSIS — T40605A Adverse effect of unspecified narcotics, initial encounter: Secondary | ICD-10-CM | POA: Diagnosis present

## 2013-12-15 DIAGNOSIS — Z8249 Family history of ischemic heart disease and other diseases of the circulatory system: Secondary | ICD-10-CM

## 2013-12-15 DIAGNOSIS — Z8546 Personal history of malignant neoplasm of prostate: Secondary | ICD-10-CM | POA: Diagnosis not present

## 2013-12-15 DIAGNOSIS — E782 Mixed hyperlipidemia: Secondary | ICD-10-CM

## 2013-12-15 DIAGNOSIS — F329 Major depressive disorder, single episode, unspecified: Secondary | ICD-10-CM | POA: Diagnosis present

## 2013-12-15 DIAGNOSIS — Z79899 Other long term (current) drug therapy: Secondary | ICD-10-CM

## 2013-12-15 DIAGNOSIS — T424X5A Adverse effect of benzodiazepines, initial encounter: Secondary | ICD-10-CM | POA: Diagnosis present

## 2013-12-15 DIAGNOSIS — E871 Hypo-osmolality and hyponatremia: Secondary | ICD-10-CM | POA: Diagnosis present

## 2013-12-15 DIAGNOSIS — R072 Precordial pain: Secondary | ICD-10-CM | POA: Diagnosis present

## 2013-12-15 DIAGNOSIS — M5137 Other intervertebral disc degeneration, lumbosacral region: Secondary | ICD-10-CM | POA: Diagnosis present

## 2013-12-15 DIAGNOSIS — Z86711 Personal history of pulmonary embolism: Secondary | ICD-10-CM

## 2013-12-15 DIAGNOSIS — M109 Gout, unspecified: Secondary | ICD-10-CM | POA: Diagnosis present

## 2013-12-15 DIAGNOSIS — R9439 Abnormal result of other cardiovascular function study: Secondary | ICD-10-CM

## 2013-12-15 DIAGNOSIS — F3289 Other specified depressive episodes: Secondary | ICD-10-CM | POA: Diagnosis present

## 2013-12-15 DIAGNOSIS — X58XXXA Exposure to other specified factors, initial encounter: Secondary | ICD-10-CM

## 2013-12-15 DIAGNOSIS — E785 Hyperlipidemia, unspecified: Secondary | ICD-10-CM | POA: Diagnosis present

## 2013-12-15 DIAGNOSIS — I2699 Other pulmonary embolism without acute cor pulmonale: Secondary | ICD-10-CM

## 2013-12-15 DIAGNOSIS — M51379 Other intervertebral disc degeneration, lumbosacral region without mention of lumbar back pain or lower extremity pain: Secondary | ICD-10-CM | POA: Diagnosis present

## 2013-12-15 LAB — TROPONIN I: Troponin I: <0.3 ng/mL (ref <0.30)

## 2013-12-15 LAB — CBC WITH DIFFERENTIAL/PLATELET
Basophils Absolute: 0 10*3/uL (ref 0.0–0.1)
Basophils Relative: 0 % (ref 0–1)
Eosinophils Absolute: 0 10*3/uL (ref 0.0–0.7)
Eosinophils Relative: 0 % (ref 0–5)
HCT: 40.2 % (ref 39.0–52.0)
HEMOGLOBIN: 13.9 g/dL (ref 13.0–17.0)
LYMPHS ABS: 1.6 10*3/uL (ref 0.7–4.0)
LYMPHS PCT: 15 % (ref 12–46)
MCH: 33.9 pg (ref 26.0–34.0)
MCHC: 34.6 g/dL (ref 30.0–36.0)
MCV: 98 fL (ref 78.0–100.0)
MONOS PCT: 15 % — AB (ref 3–12)
Monocytes Absolute: 1.6 10*3/uL — ABNORMAL HIGH (ref 0.1–1.0)
NEUTROS ABS: 7.3 10*3/uL (ref 1.7–7.7)
NEUTROS PCT: 70 % (ref 43–77)
Platelets: 242 10*3/uL (ref 150–400)
RBC: 4.1 MIL/uL — AB (ref 4.22–5.81)
RDW: 12.7 % (ref 11.5–15.5)
WBC: 10.6 10*3/uL — ABNORMAL HIGH (ref 4.0–10.5)

## 2013-12-15 LAB — URINE MICROSCOPIC-ADD ON

## 2013-12-15 LAB — BASIC METABOLIC PANEL
Anion gap: 13 (ref 5–15)
BUN: 12 mg/dL (ref 6–23)
CALCIUM: 9.5 mg/dL (ref 8.4–10.5)
CO2: 27 mEq/L (ref 19–32)
Chloride: 94 mEq/L — ABNORMAL LOW (ref 96–112)
Creatinine, Ser: 0.65 mg/dL (ref 0.50–1.35)
GFR calc Af Amer: >90 mL/min (ref >90)
Glucose, Bld: 118 mg/dL — ABNORMAL HIGH (ref 70–99)
Potassium: 4.1 mEq/L (ref 3.7–5.3)
SODIUM: 134 meq/L — AB (ref 137–147)

## 2013-12-15 LAB — URINALYSIS, ROUTINE W REFLEX MICROSCOPIC
BILIRUBIN URINE: NEGATIVE
Glucose, UA: NEGATIVE mg/dL
KETONES UR: NEGATIVE mg/dL
Leukocytes, UA: NEGATIVE
Nitrite: NEGATIVE
PH: 5.5 (ref 5.0–8.0)
Specific Gravity, Urine: 1.02 (ref 1.005–1.030)
Urobilinogen, UA: 0.2 mg/dL (ref 0.0–1.0)

## 2013-12-15 MED ORDER — AMLODIPINE BESYLATE 5 MG PO TABS
5.0000 mg | ORAL_TABLET | Freq: Every morning | ORAL | Status: DC
  Administered 2013-12-16 – 2013-12-19 (×4): 5 mg via ORAL
  Filled 2013-12-15 (×4): qty 1

## 2013-12-15 MED ORDER — ONDANSETRON HCL 4 MG/2ML IJ SOLN
4.0000 mg | Freq: Once | INTRAMUSCULAR | Status: AC
  Administered 2013-12-15: 4 mg via INTRAVENOUS
  Filled 2013-12-15: qty 2

## 2013-12-15 MED ORDER — MORPHINE SULFATE 4 MG/ML IJ SOLN
4.0000 mg | INTRAMUSCULAR | Status: DC | PRN
  Administered 2013-12-15 (×2): 4 mg via INTRAVENOUS
  Filled 2013-12-15 (×2): qty 1

## 2013-12-15 MED ORDER — SODIUM CHLORIDE 0.9 % IJ SOLN
3.0000 mL | Freq: Two times a day (BID) | INTRAMUSCULAR | Status: DC
  Administered 2013-12-15: 3 mL via INTRAVENOUS

## 2013-12-15 MED ORDER — TAMSULOSIN HCL 0.4 MG PO CAPS
ORAL_CAPSULE | ORAL | Status: AC
  Filled 2013-12-15: qty 1

## 2013-12-15 MED ORDER — HYDROMORPHONE HCL PF 1 MG/ML IJ SOLN
1.0000 mg | INTRAMUSCULAR | Status: DC | PRN
  Administered 2013-12-16 (×2): 1 mg via INTRAVENOUS
  Filled 2013-12-15 (×2): qty 1

## 2013-12-15 MED ORDER — TAMSULOSIN HCL 0.4 MG PO CAPS
0.4000 mg | ORAL_CAPSULE | Freq: Once | ORAL | Status: AC
  Administered 2013-12-15: 0.4 mg via ORAL
  Filled 2013-12-15: qty 1

## 2013-12-15 MED ORDER — PANTOPRAZOLE SODIUM 40 MG PO TBEC
80.0000 mg | DELAYED_RELEASE_TABLET | Freq: Every day | ORAL | Status: DC
  Administered 2013-12-16 – 2013-12-19 (×4): 80 mg via ORAL
  Filled 2013-12-15 (×4): qty 2

## 2013-12-15 MED ORDER — RIVAROXABAN 20 MG PO TABS
20.0000 mg | ORAL_TABLET | Freq: Every day | ORAL | Status: DC
  Administered 2013-12-15: 20 mg via ORAL
  Filled 2013-12-15: qty 1

## 2013-12-15 MED ORDER — ONDANSETRON HCL 4 MG/2ML IJ SOLN: 4.0000 mg | Freq: Four times a day (QID) | INTRAMUSCULAR | Status: DC | PRN

## 2013-12-15 MED ORDER — SODIUM CHLORIDE 0.9 % IJ SOLN: 3.0000 mL | INTRAMUSCULAR | Status: DC | PRN

## 2013-12-15 MED ORDER — ALLOPURINOL 100 MG PO TABS
100.0000 mg | ORAL_TABLET | Freq: Every morning | ORAL | Status: DC
  Administered 2013-12-16 – 2013-12-19 (×4): 100 mg via ORAL
  Filled 2013-12-15 (×4): qty 1

## 2013-12-15 MED ORDER — DIAZEPAM 5 MG/ML IJ SOLN
5.0000 mg | Freq: Once | INTRAMUSCULAR | Status: AC
  Administered 2013-12-15: 5 mg via INTRAVENOUS
  Filled 2013-12-15: qty 2

## 2013-12-15 MED ORDER — SODIUM CHLORIDE 0.9 % IV SOLN
250.0000 mL | INTRAVENOUS | Status: DC | PRN
  Administered 2013-12-16: 250 mL via INTRAVENOUS

## 2013-12-15 MED ORDER — SODIUM CHLORIDE 0.9 % IJ SOLN
3.0000 mL | Freq: Two times a day (BID) | INTRAMUSCULAR | Status: DC
  Administered 2013-12-18 – 2013-12-19 (×2): 3 mL via INTRAVENOUS

## 2013-12-15 MED ORDER — LORAZEPAM 0.5 MG PO TABS: 0.5000 mg | ORAL_TABLET | Freq: Every day | ORAL | Status: DC | PRN

## 2013-12-15 MED ORDER — SIMVASTATIN 20 MG PO TABS
20.0000 mg | ORAL_TABLET | Freq: Every day | ORAL | Status: DC
  Administered 2013-12-16 – 2013-12-20 (×5): 20 mg via ORAL
  Filled 2013-12-15 (×5): qty 1

## 2013-12-15 MED ORDER — METHOCARBAMOL 500 MG PO TABS
ORAL_TABLET | ORAL | Status: AC
  Filled 2013-12-15: qty 2

## 2013-12-15 MED ORDER — ESCITALOPRAM OXALATE 10 MG PO TABS
10.0000 mg | ORAL_TABLET | Freq: Every morning | ORAL | Status: DC
  Administered 2013-12-16 – 2013-12-19 (×4): 10 mg via ORAL
  Filled 2013-12-15 (×4): qty 1

## 2013-12-15 MED ORDER — ONDANSETRON HCL 4 MG PO TABS: 4.0000 mg | ORAL_TABLET | Freq: Four times a day (QID) | ORAL | Status: DC | PRN

## 2013-12-15 MED ORDER — METHOCARBAMOL 1000 MG/10ML IJ SOLN
1000.0000 mg | Freq: Once | INTRAVENOUS | Status: AC
  Administered 2013-12-15: 1000 mg via INTRAVENOUS
  Filled 2013-12-15: qty 10

## 2013-12-15 MED ORDER — VITAMIN D 1000 UNITS PO TABS
1000.0000 [IU] | ORAL_TABLET | Freq: Every morning | ORAL | Status: DC
  Administered 2013-12-16 – 2013-12-19 (×4): 1000 [IU] via ORAL
  Filled 2013-12-15 (×4): qty 1

## 2013-12-15 MED ORDER — METHOCARBAMOL 1000 MG/10ML IJ SOLN
500.0000 mg | Freq: Three times a day (TID) | INTRAVENOUS | Status: DC
  Administered 2013-12-16 (×2): 500 mg via INTRAVENOUS
  Filled 2013-12-15 (×12): qty 5

## 2013-12-15 NOTE — ED Notes (Signed)
Pt denies any chest pain while in the ED

## 2013-12-15 NOTE — ED Notes (Signed)
Dr.Lama in for patient assessment for admission to hospital.

## 2013-12-15 NOTE — ED Notes (Signed)
Pt states he just had another spasm and descided he will take his pain medication.

## 2013-12-15 NOTE — H&P (Signed)
PCP:   Sherrie Mustache, MD   Chief Complaint:  Back pain  HPI: 80 year old male who   has a past medical history of Essential hypertension, benign; Hypercholesteremia; Depression; Acid reflux; Gout; Prostate cancer; and Pulmonary emboli. Today presents to the hospital with chief complaint of right lower back pain and spasm which started yesterday. Patient reports no injury or fall or trauma. Patient has a history of back problems and has been followed by [facility] orth[facility]. Patient says that he has pinched nerve which caused radiation of the pain in the left buttock in the past, but today the pain is located in the right lower back and radiates up towards. The pain becomes worse on movement. Patient denies dysuria but does have urgency of urination half to the prostate surgery. Patient also has a recent history of pulmonary embolism and was started on Xarelto. He also complains of left-sided chest pain, denies nausea vomiting or diarrhea. Patient has history of nodule in the right upper lung and is scheduled to go for surgery on 12/23/2013. In the ED CT abdomen pelvis was done which shows compression at L4 superior endplate.  Patient did receive Valium 5 mg along with morphine 4 mg, and after that had a brief episode of hypoxia with O2 sats dropping to 85% after he received about medications. Patient is currently on oxygen and O2 sats hour 99%.  Allergies:   Allergies  Allergen Reactions  . Asa [Aspirin] Anaphylaxis      Past Medical History  Diagnosis Date  . Essential hypertension, benign   . Hypercholesteremia   . Depression   . Acid reflux   . Gout   . Prostate cancer   . Pulmonary emboli     Past Surgical History  Procedure Laterality Date  . Fracture surgery    . Hemorrhoid surgery    . Hernia repair    . Prostate surgery      prostate seed implantation  . Left elbow surgery      tendon repair?  . Tonsillectomy      Prior to Admission medications     Medication Sig Start Date End Date Taking? Authorizing Provider  allopurinol (ZYLOPRIM) 100 MG tablet Take 100 mg by mouth every morning.    Yes Historical Provider, MD  amLODipine (NORVASC) 5 MG tablet Take 5 mg by mouth every morning.    Yes Historical Provider, MD  cholecalciferol (VITAMIN D) 1000 UNITS tablet Take 1,000 Units by mouth every morning.   Yes Historical Provider, MD  Cyanocobalamin (B-12 PO) Take 1 tablet by mouth daily.   Yes Historical Provider, MD  escitalopram (LEXAPRO) 10 MG tablet Take 10 mg by mouth every morning.    Yes Historical Provider, MD  esomeprazole (NEXIUM) 40 MG capsule Take 40 mg by mouth every morning.    Yes Historical Provider, MD  HYDROcodone-acetaminophen (NORCO/VICODIN) 5-325 MG per tablet Take 1 tablet by mouth every 6 (six) hours as needed for pain. 02/07/13  Yes Samuella Cota, MD  LORazepam (ATIVAN) 0.5 MG tablet Take 0.5 mg by mouth daily as needed for anxiety.   Yes Historical Provider, MD  Multiple Vitamin (MULTIVITAMIN) tablet Take 1 tablet by mouth daily.   Yes Historical Provider, MD  nitroGLYCERIN (NITROSTAT) 0.4 MG SL tablet Place 1 tablet (0.4 mg total) under the tongue every 5 (five) minutes as needed for chest pain. Up to 3 doses. If no relief after 3rd dose, proceed to ED for evaluation. 01/20/13  Yes Donney Dice, PA-C  pravastatin (PRAVACHOL) 40 MG tablet Take 40 mg by mouth every morning.    Yes Historical Provider, MD  pyridOXINE (VITAMIN B-6) 100 MG tablet Take 100 mg by mouth every morning.    Yes Historical Provider, MD  Rivaroxaban (XARELTO) 20 MG TABS tablet Take 20 mg by mouth daily with supper.   Yes Historical Provider, MD  vitamin E 400 UNIT capsule Take 400 Units by mouth daily.   Yes Historical Provider, MD    Social History:  reports that he has been smoking Cigarettes.  He has been smoking about 0.00 packs per day. He does not have any smokeless tobacco history on file. He reports that he drinks about 12 ounces of  alcohol per week. He reports that he does not use illicit drugs.  Family History  Problem Relation Age of Onset  . Arrhythmia Mother     Pacemaker  . CAD Brother     CABG     All the positives are listed in BOLD  Review of Systems:  HEENT: Headache, blurred vision, runny nose, sore throat Neck: Hypothyroidism, hyperthyroidism,,lymphadenopathy Chest : Shortness of breath, history of COPD, Asthma Heart : Chest pain, history of coronary arterey disease GI:  Nausea, vomiting, diarrhea, constipation, GERD GU: Dysuria, urgency, frequency of urination, hematuria Neuro: Stroke, seizures, syncope Psych: Depression, anxiety, hallucinations   Physical Exam: Blood pressure 114/69, pulse 73, temperature 98.8 F (37.1 C), temperature source Oral, resp. rate 16, height 5\' 10"  (1.778 m), weight 74.39 kg (164 lb), SpO2 99.00%. Constitutional:   Patient is a well-developed and well-nourished male in no acute distress and cooperative with exam. Head: Normocephalic and atraumatic Mouth: Mucus membranes moist Eyes: PERRL, EOMI, conjunctivae normal Neck: Supple, No Thyromegaly Cardiovascular: RRR, S1 normal, S2 normal Pulmonary/Chest: CTAB, no wheezes, rales, or rhonchi Abdominal: Soft. Non-tender, non-distended, bowel sounds are normal, no masses, organomegaly, or guarding present.  Back- positive right paraspinal tenderness in the lumbar region.  Neurological: A&O x3, Strenght is normal and symmetric bilaterally, cranial nerve II-XII are grossly intact, no focal motor deficit, sensory intact to light touch bilaterally.  Extremities : No Cyanosis, Clubbing or Edema  Labs on Admission:  Basic Metabolic Panel:  Recent Labs Lab 12/15/13 1530  NA 134*  K 4.1  CL 94*  CO2 27  GLUCOSE 118*  BUN 12  CREATININE 0.65  CALCIUM 9.5   Liver Function Tests: No results found for this basename: AST, ALT, ALKPHOS, BILITOT, PROT, ALBUMIN,  in the last 168 hours No results found for this basename:  LIPASE, AMYLASE,  in the last 168 hours No results found for this basename: AMMONIA,  in the last 168 hours CBC:  Recent Labs Lab 12/15/13 1530  WBC 10.6*  NEUTROABS 7.3  HGB 13.9  HCT 40.2  MCV 98.0  PLT 242   Cardiac Enzymes:  Recent Labs Lab 12/15/13 1530  TROPONINI <0.30    BNP (last 3 results)  Recent Labs  02/04/13 1238  PROBNP 153.1   CBG: No results found for this basename: GLUCAP,  in the last 168 hours  Radiological Exams on Admission: Ct Abdomen Pelvis Wo Contrast  12/15/2013   CLINICAL DATA:  Low back spasms and hematuria. History of pulmonary nodule and prostate cancer.  EXAM: CT ABDOMEN AND PELVIS WITHOUT CONTRAST  TECHNIQUE: Multidetector CT imaging of the abdomen and pelvis was performed following the standard protocol without IV contrast.  COMPARISON:  PET CTs dated 04/25/2013 and 02/11/2013. Chest CT 11/15/2013.  FINDINGS: There is new subsegmental atelectasis  at both lung bases. No confluent airspace opacity or significant pleural effusion is present. There is diffuse atherosclerosis of the aorta and coronary arteries.  Again demonstrated are numerous bilateral renal calculi. There is a 14 mm right upper pole caliceal calculus. There is no hydronephrosis or ureteral calculus. Low-density renal lesions bilaterally are grossly stable.  The liver, gallbladder, spleen, adrenal glands and pancreas appear stable without significant findings.  There is extensive atherosclerosis of the aorta, its branches and the iliac arteries. 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  The stomach, small bowel and colon demonstrate no significant findings. A left inguinal hernia has enlarged and now contains a knuckle of the sigmoid colon. There is no evidence of bowel incarceration or obstruction. Postsurgical changes are present in the right groin. Prostate brachytherapy seeds are noted. There is no pelvic lymphadenopathy. The bladder appears normal.  There is  a prominent Schmorl's node and superior endplate compression deformity at L4. The stability of this finding is difficult to address based on axial images from prior PET CT, although some progression is suspected. There are no worrisome osseous findings.  IMPRESSION: 1. Numerous bilateral renal calculi. No evidence of ureteral calculus or hydronephrosis. 2. Grossly stable low density renal lesions bilaterally. 3. 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 4. Enlarging left inguinal hernia containing a knuckle of the sigmoid colon. No evidence of incarceration or bowel obstruction. 5. Superior endplate compression deformity at L4, likely progressive from available prior studies.   Electronically Signed   By: Camie Patience M.D.   On: 12/15/2013 19:58   Dg Chest Portable 1 View  12/15/2013   CLINICAL DATA:  CHEST PAIN  EXAM: PORTABLE CHEST - 1 VIEW  COMPARISON:  Two-view chest 05/08/2013  FINDINGS: Low lung volumes. The heart size and mediastinal contours are within normal limits. Both lungs are clear. The visualized skeletal structures are unremarkable.  IMPRESSION: No active disease.   Electronically Signed   By: Margaree Mackintosh M.D.   On: 12/15/2013 15:46    EKG: Independently reviewed. Normal sinus rhythm   Assessment/Plan Active Problems:   Precordial pain   Essential hypertension, benign   GERD (gastroesophageal reflux disease)   Back pain   Hx pulmonary embolism  Back pain Patient has a history of disc herniation as per patient. He has been followed by [facility], will obtain MRI of the lumbar spine. Will start the patient on Dilaudid 2 mg IV every 4 hours when necessary, Robaxin 500 mg IV every 8 hours.  Chest pain Patient has left-sided chest pain, will obtain 3 sets of cardiac enzymes. Troponin is negative in the ED.   History of pulmonary embolism Patient has history of pulmonary embolism and is currently on  anticoagulation with Xarelto, he is currently not hypoxic or short of breath. Will continue the anticoagulation.  Episode of hypoxia Patient had episode of hypoxia after he received Valium 5 mg IV and morphine 4 mg IVin the ED, which has now resolved after being put on oxygen. Will repeat  CT angiogram chest to rule out recurrent PE. Will monitor the patient on continuous pulse oximetry.  Hypertension Continue amlodipine  History of lung nodule Patient is scheduled to go for surgery on 12/23/2013 at [facility].  Code status: Patient is full code  Family discussion: Admission, patients condition and plan of care including tests being ordered have been discussed with the patient and niece, brother-in-law who indicate understanding  and agree with the plan and Code Status.   Time Spent on Admission: 65 minutes  [facility] Pager: [phone] 12/15/2013, 9:32 PM  If 7PM-7AM, please contact night-coverage  [email or web address]  Password [email or web address]

## 2013-12-15 NOTE — ED Notes (Signed)
CRITICAL VALUE ALERT  Critical value received:  Potassium 2.2  Date of notification:  12/15/13  Time of notification:  2029  Critical value read back:yes  Nurse who received alert:  Andy Gauss  MD notified (1st page):    Time of first page:    MD notified (2nd page):  Time of second page:  Responding MD:  Rogene Houston  Time MD responded:  2029

## 2013-12-15 NOTE — ED Notes (Signed)
Pt complain of severe back spasms. Pt states he has always had back issues but denies any injury. Pt also compliant of a pain in left side of chest that comes and goes

## 2013-12-15 NOTE — ED Notes (Signed)
Patient resting in bed in a position of comfort. Eyes closed, no needs voiced at this time.

## 2013-12-15 NOTE — ED Provider Notes (Signed)
CSN: [id number]     Arrival date & time 12/15/13  1459 History   First MD Initiated Contact with Patient 12/15/13 1510     Chief Complaint  Patient presents with  . Chest Pain      HPI  Patient presents complaining primarily of right lower back pain and spasm. No falls injuries or trauma. No unusual activity. It started this morning. He states he has "a touch of chest pain sometimes".  Denies9 pain right now. Points to his right flank, and low back on the right as area of pain.. Seems  spasmodic pain in his back. Non pleuretic.  Not sob.  No dysuria, frequency, or hematuria. Patient had a history of pulmonary embolus over a year ago. Remains on Xarelto. Pain is not similar.  Past Medical History  Diagnosis Date  . Essential hypertension, benign   . Hypercholesteremia   . Depression   . Acid reflux   . Gout   . Prostate cancer   . Pulmonary emboli    Past Surgical History  Procedure Laterality Date  . Fracture surgery    . Hemorrhoid surgery    . Hernia repair    . Prostate surgery      prostate seed implantation  . Left elbow surgery      tendon repair?  . Tonsillectomy     Family History  Problem Relation Age of Onset  . Arrhythmia Mother     Pacemaker  . CAD Brother     CABG   History  Substance Use Topics  . Smoking status: Current Some Day Smoker    Types: Cigarettes  . Smokeless tobacco: Not on file  . Alcohol Use: 12.0 oz/week    24 drink(s) per week    Review of Systems  Constitutional: Negative for fever, chills, diaphoresis, appetite change and fatigue.  HENT: Negative for mouth sores, sore throat and trouble swallowing.   Eyes: Negative for visual disturbance.  Respiratory: Negative for cough, chest tightness, shortness of breath and wheezing.        Some episodes of spasms elicited with deep breathing or cough  Cardiovascular: Positive for chest pain.  Gastrointestinal: Negative for nausea, vomiting, abdominal pain, diarrhea and abdominal distention.   Endocrine: Negative for polydipsia, polyphagia and polyuria.  Genitourinary: Negative for dysuria, frequency and hematuria.  Musculoskeletal: Positive for back pain. Negative for gait problem.       Right lower and mid back  Skin: Negative for color change, pallor and rash.  Neurological: Negative for dizziness, syncope, light-headedness and headaches.  Hematological: Does not bruise/bleed easily.  Psychiatric/Behavioral: Negative for behavioral problems and confusion.      Allergies  Asa  Home Medications   Prior to Admission medications   Medication Sig Start Date End Date Taking? Authorizing Provider  allopurinol (ZYLOPRIM) 100 MG tablet Take 100 mg by mouth every morning.    Yes Historical Provider, MD  amLODipine (NORVASC) 5 MG tablet Take 5 mg by mouth every morning.    Yes Historical Provider, MD  cholecalciferol (VITAMIN D) 1000 UNITS tablet Take 1,000 Units by mouth every morning.   Yes Historical Provider, MD  Cyanocobalamin (B-12 PO) Take 1 tablet by mouth daily.   Yes Historical Provider, MD  escitalopram (LEXAPRO) 10 MG tablet Take 10 mg by mouth every morning.    Yes Historical Provider, MD  esomeprazole (NEXIUM) 40 MG capsule Take 40 mg by mouth every morning.    Yes Historical Provider, MD  HYDROcodone-acetaminophen (NORCO/VICODIN) 5-325 MG per  tablet Take 1 tablet by mouth every 6 (six) hours as needed for pain. 02/07/13  Yes Samuella Cota, MD  LORazepam (ATIVAN) 0.5 MG tablet Take 0.5 mg by mouth daily as needed for anxiety.   Yes Historical Provider, MD  Multiple Vitamin (MULTIVITAMIN) tablet Take 1 tablet by mouth daily.   Yes Historical Provider, MD  nitroGLYCERIN (NITROSTAT) 0.4 MG SL tablet Place 1 tablet (0.4 mg total) under the tongue every 5 (five) minutes as needed for chest pain. Up to 3 doses. If no relief after 3rd dose, proceed to ED for evaluation. 01/20/13  Yes Burna Forts Serpe, PA-C  pravastatin (PRAVACHOL) 40 MG tablet Take 40 mg by mouth every  morning.    Yes Historical Provider, MD  pyridOXINE (VITAMIN B-6) 100 MG tablet Take 100 mg by mouth every morning.    Yes Historical Provider, MD  Rivaroxaban (XARELTO) 20 MG TABS tablet Take 20 mg by mouth daily with supper.   Yes Historical Provider, MD  vitamin E 400 UNIT capsule Take 400 Units by mouth daily.   Yes Historical Provider, MD   BP 130/69  Pulse 73  Temp(Src) 98.8 F (37.1 C) (Oral)  Resp 13  Ht 5\' 10"  (1.778 m)  Wt 164 lb (74.39 kg)  BMI 23.53 kg/m2  SpO2 99% Physical Exam  Constitutional: He is oriented to person, place, and time. He appears well-developed and well-nourished. No distress.  HENT:  Head: Normocephalic.  Eyes: Conjunctivae are normal. Pupils are equal, round, and reactive to light. No scleral icterus.  Neck: Normal range of motion. Neck supple. No thyromegaly present.  Cardiovascular: Normal rate and regular rhythm.  Exam reveals no gallop and no friction rub.   No murmur heard. Pulmonary/Chest: Effort normal and breath sounds normal. No respiratory distress. He has no wheezes. He has no rales.  Her bilateral breath sounds without diminished breath sounds are focal changes.  Abdominal: Soft. Bowel sounds are normal. He exhibits no distension. There is no tenderness. There is no rebound.  Musculoskeletal: Normal range of motion.       Back:  Neurological: He is alert and oriented to person, place, and time.  Normal strength and use of the upper extremity. Normal gait although he does have some episodes of pain with ambulation.  Skin: Skin is warm and dry. No rash noted.  Psychiatric: He has a normal mood and affect. His behavior is normal.    ED Course  Procedures (including critical care time) Labs Review Labs Reviewed  CBC WITH DIFFERENTIAL - Abnormal; Notable for the following:    WBC 10.6 (*)    RBC 4.10 (*)    Monocytes Relative 15 (*)    Monocytes Absolute 1.6 (*)    All other components within normal limits  BASIC METABOLIC PANEL -  Abnormal; Notable for the following:    Sodium 134 (*)    Chloride 94 (*)    Glucose, Bld 118 (*)    All other components within normal limits  URINALYSIS, ROUTINE W REFLEX MICROSCOPIC - Abnormal; Notable for the following:    Hgb urine dipstick LARGE (*)    Protein, ur TRACE (*)    All other components within normal limits  URINE MICROSCOPIC-ADD ON - Abnormal; Notable for the following:    Bacteria, UA FEW (*)    All other components within normal limits  TROPONIN I    Imaging Review Ct Abdomen Pelvis Wo Contrast  12/15/2013   CLINICAL DATA:  Low back spasms and hematuria.  History of pulmonary nodule and prostate cancer.  EXAM: CT ABDOMEN AND PELVIS WITHOUT CONTRAST  TECHNIQUE: Multidetector CT imaging of the abdomen and pelvis was performed following the standard protocol without IV contrast.  COMPARISON:  PET CTs dated 04/25/2013 and 02/11/2013. Chest CT 11/15/2013.  FINDINGS: There is new subsegmental atelectasis at both lung bases. No confluent airspace opacity or significant pleural effusion is present. There is diffuse atherosclerosis of the aorta and coronary arteries.  Again demonstrated are numerous bilateral renal calculi. There is a 14 mm right upper pole caliceal calculus. There is no hydronephrosis or ureteral calculus. Low-density renal lesions bilaterally are grossly stable.  The liver, gallbladder, spleen, adrenal glands and pancreas appear stable without significant findings.  There is extensive atherosclerosis of the aorta, its branches and the iliac arteries. 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  The stomach, small bowel and colon demonstrate no significant findings. A left inguinal hernia has enlarged and now contains a knuckle of the sigmoid colon. There is no evidence of bowel incarceration or obstruction. Postsurgical changes are present in the right groin. Prostate brachytherapy seeds are noted. There is no pelvic lymphadenopathy. The bladder  appears normal.  There is a prominent Schmorl's node and superior endplate compression deformity at L4. The stability of this finding is difficult to address based on axial images from prior PET CT, although some progression is suspected. There are no worrisome osseous findings.  IMPRESSION: 1. Numerous bilateral renal calculi. No evidence of ureteral calculus or hydronephrosis. 2. Grossly stable low density renal lesions bilaterally. 3. 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 4. Enlarging left inguinal hernia containing a knuckle of the sigmoid colon. No evidence of incarceration or bowel obstruction. 5. Superior endplate compression deformity at L4, likely progressive from available prior studies.   Electronically Signed   By: Camie Patience M.D.   On: 12/15/2013 19:58   Dg Chest Portable 1 View  12/15/2013   CLINICAL DATA:  CHEST PAIN  EXAM: PORTABLE CHEST - 1 VIEW  COMPARISON:  Two-view chest 05/08/2013  FINDINGS: Low lung volumes. The heart size and mediastinal contours are within normal limits. Both lungs are clear. The visualized skeletal structures are unremarkable.  IMPRESSION: No active disease.   Electronically Signed   By: Margaree Mackintosh M.D.   On: 12/15/2013 15:46     EKG Interpretation   Date/Time:  Thursday December 15 2013 15:16:45 EDT Ventricular Rate:  86 PR Interval:  150 QRS Duration: 104 QT Interval:  354 QTC Calculation: 423 R Axis:   -79 Text Interpretation:  Sinus rhythm Left anterior fascicular block RSR' in  V1 or V2, right VCD or RVH Baseline wander in lead(s) V6 Confirmed by  Jeneen Rinks  MD, [place] ([id number]) on 12/15/2013 5:33:53 PM      MDM   Final diagnoses:  Right-sided low back pain without sciatica    Patient given Robaxin, morphine, Valium. Slight improvement. Repeat thallium total of 10 mg. Repeat morphine. Some relief. Was actually able to sleep for approximately 1 hour without episodes of symptoms. However  upon awaking with any movement he has sudden spasmodic pain in his right back. He remains well oxygenated. Pain remains nonpleuritic. He is not tachycardic. No pain in his chest. CT is secondary hematuria    Tanna Furry, MD 12/15/13 2102

## 2013-12-15 NOTE — ED Notes (Addendum)
Chest pain since last night, with pain both elbows.  "low back spasms"  No N/V   Recent adm with pulm emboli and cont to be on xarelto.  Plans surgery for pulm nodule 7/10

## 2013-12-16 DIAGNOSIS — E782 Mixed hyperlipidemia: Secondary | ICD-10-CM

## 2013-12-16 DIAGNOSIS — K219 Gastro-esophageal reflux disease without esophagitis: Secondary | ICD-10-CM

## 2013-12-16 LAB — CBC
HCT: 37.4 % — ABNORMAL LOW (ref 39.0–52.0)
HEMOGLOBIN: 13 g/dL (ref 13.0–17.0)
MCH: 34 pg (ref 26.0–34.0)
MCHC: 34.8 g/dL (ref 30.0–36.0)
MCV: 97.9 fL (ref 78.0–100.0)
Platelets: 213 10*3/uL (ref 150–400)
RBC: 3.82 MIL/uL — ABNORMAL LOW (ref 4.22–5.81)
RDW: 12.5 % (ref 11.5–15.5)
WBC: 10.3 10*3/uL (ref 4.0–10.5)

## 2013-12-16 LAB — COMPREHENSIVE METABOLIC PANEL
ALBUMIN: 3.2 g/dL — AB (ref 3.5–5.2)
ALK PHOS: 74 U/L (ref 39–117)
ALT: 8 U/L (ref 0–53)
ANION GAP: 11 (ref 5–15)
AST: 16 U/L (ref 0–37)
BILIRUBIN TOTAL: 0.8 mg/dL (ref 0.3–1.2)
BUN: 10 mg/dL (ref 6–23)
CHLORIDE: 92 meq/L — AB (ref 96–112)
CO2: 26 mEq/L (ref 19–32)
Calcium: 9.1 mg/dL (ref 8.4–10.5)
Creatinine, Ser: 0.68 mg/dL (ref 0.50–1.35)
GFR calc non Af Amer: 88 mL/min — ABNORMAL LOW (ref >90)
Glucose, Bld: 119 mg/dL — ABNORMAL HIGH (ref 70–99)
POTASSIUM: 3.9 meq/L (ref 3.7–5.3)
SODIUM: 129 meq/L — AB (ref 137–147)
Total Protein: 6.3 g/dL (ref 6.0–8.3)

## 2013-12-16 LAB — TROPONIN I

## 2013-12-16 MED ORDER — SODIUM CHLORIDE 0.9 % IV SOLN
INTRAVENOUS | Status: DC
  Administered 2013-12-16 – 2013-12-20 (×6): via INTRAVENOUS

## 2013-12-16 MED ORDER — METHOCARBAMOL 1000 MG/10ML IJ SOLN
INTRAMUSCULAR | Status: AC
  Filled 2013-12-16: qty 10

## 2013-12-16 MED ORDER — IOHEXOL 350 MG/ML SOLN
100.0000 mL | Freq: Once | INTRAVENOUS | Status: AC | PRN
  Administered 2013-12-16: 100 mL via INTRAVENOUS

## 2013-12-16 MED ORDER — HYDROCODONE-ACETAMINOPHEN 5-325 MG PO TABS
1.0000 | ORAL_TABLET | Freq: Four times a day (QID) | ORAL | Status: DC | PRN
  Administered 2013-12-16 – 2013-12-18 (×7): 1 via ORAL
  Filled 2013-12-16 (×7): qty 1

## 2013-12-16 MED ORDER — METHOCARBAMOL 500 MG PO TABS
500.0000 mg | ORAL_TABLET | Freq: Three times a day (TID) | ORAL | Status: DC
  Administered 2013-12-16 – 2013-12-20 (×12): 500 mg via ORAL
  Filled 2013-12-16 (×13): qty 1

## 2013-12-16 NOTE — Progress Notes (Signed)
Patient states he is supposed to discontinue Xarelto on or after 12/16/2013 to prepare for planned surgery on 12/23/2013 per Dr. Jene Every at [facility]. Dr. Darrick Meigs notified and advised that Dr. Roderic Palau is consulted before discontinuing Xarelto. Will pass on information in report to first shift.

## 2013-12-16 NOTE — Care Management Note (Unsigned)
    Page 1 of 1   12/19/2013     5:16:07 PM CARE MANAGEMENT NOTE 12/19/2013  Patient:  ARIANNA, DELSANTO   Account Number:  [id number]  Date Initiated:  12/16/2013  Documentation initiated by:  Vladimir Creeks  Subjective/Objective Assessment:   Pt is from home alone. He has a Fx L4, and severe pain. He has chronic back pain but says this is a new much worse pain. He has a lung nodule and is to have surg on the 10th. He is on Zeralto for a recently discovered PE     Action/Plan:   He plans to return home,  and  will need HH PT and Aide to assist if he is able to return home   Anticipated DC Date:  12/17/2013   Anticipated DC Plan:  [place]  CM consult      [place]   Choice offered to / List presented to:  C-1 Patient        [place] arranged  [place].   Status of service:  In process, will continue to follow Medicare Important Message given?  YES (If response is "NO", the following Medicare IM given date fields will be blank) Date Medicare IM given:  12/16/2013 Medicare IM given by:  Vladimir Creeks Date Additional Medicare IM given:  12/19/2013 Additional Medicare IM given by:  Vladimir Creeks  Discharge Disposition:    Per UR Regulation:  Reviewed for med. necessity/level of care/duration of stay  If discussed at [place] of Stay Meetings, dates discussed:    Comments:  12/19/13 1100 Justis Closser RN/CM Pt to go to [facility] in AM for kyphoplasty, and then D/C home. 12/16/13 1600 Vladimir Creeks RN/CM

## 2013-12-16 NOTE — Progress Notes (Addendum)
[facility]                                                                              Patient Demographics  Jonathon Conway, is a 80 y.o. male, DOB - 1931/07/02, VEL:[id number]  Admit date - 12/15/2013   Admitting Physician Oswald Hillock, MD  Outpatient Primary MD for the patient is Sherrie Mustache, MD  LOS - 1   Chief Complaint  Patient presents with  . Chest Pain      HPI 80 year old male with history of hypertension, hyperlipidemia, depression, prostate cancer and pulmonary emboli on Zoloft who presents emergency department with complaints of right lower back pain and spasm which started yesterday. Patient reports no injury or fall or trauma. He does have a history of back problems and has been followed by [facility] orthopedics. Patient states he's had a pinched nerve which caused radiation of the pain in the left buttock in the past but currently the pain is located in the right lower back and radiates upwards. The pain becomes worse with movement. Patient denied dysuria but does have urgency of urination due to his prostate surgery. He does have recent history of pulmonary embolism and was started on Xarelto. Patient also complained of left-sided chest pain denies any nausea, vomiting, diarrhea. Patient also has a history of a lung nodule in the right upper lung and is scheduled for surgery on 12/23/2013. In the emergency department, CT of the abdomen and pelvis was done which shows a compression at the L4 superior endplate. Patient did receive Valium 5 mg along with morphine 4 mg and after that he had a brief episode of hypoxia with O2 saturations dropping 85%.  Assessment & Plan   Intractable back pain -Patient has had a long-standing history of back pain however states this is the worse he's had. -CT of the abdomen and pelvis shows inferior endplate compression deformity at L4, likely progressive from the available prior studies -MRI ordered however cannot be done until  Monday -Spoke with Dr. Alvan Dame, orthopedic surgeon on call at [facility] recommended kyphoplasty by IR for outpatient followup with Dr. Rolena Infante. -Will continue pain management with robaxin, PRN dialudid and norco -PT consulted   Chest pain -Troponins remained negative -Patient currently chest pain-free  History of pulmonary embolism -On anticoagulation however this will need to be held due to his upcoming surgery -CT angiogram showed no acute pulmonary embolism  Acute episode of hypoxia -Likely secondary to medication including Valium as well as morphine -This has since resolved since patient has been placed on nasal cannula  Altered mental status -Likely secondary to her pain medication -UA negative for infection, chest x-ray showed no consolidation -Will continue to monitor  Hypertension -Currently controlled, continue amlodipine  History of lung nodule -Patient scheduled for surgery on 12/23/2013 at [place] will need to be held for 7 days prior to surgery  Hyponatremia/hypochloremia -Will continue IVF and continue to monitor BMP  Depression -Continue lexapro  Code Status: Full  Family Communication: None at bedside  Disposition Plan: Admitted  Time Spent in minutes   30 minutes  Procedures  None  Consults   Dr. Alvan Dame, orthopedics, via phone  DVT Prophylaxis  Xarelto  Lab Results  Component Value Date   PLT 213 12/16/2013    Medications  Scheduled Meds: . allopurinol  100 mg Oral q morning - 10a  . amLODipine  5 mg Oral q morning - 10a  . cholecalciferol  1,000 Units Oral q morning - 10a  . escitalopram  10 mg Oral q morning - 10a  . methocarbamol  500 mg Oral TID  . pantoprazole  80 mg Oral Daily  . rivaroxaban  20 mg Oral Q supper  . simvastatin  20 mg Oral q1800  . sodium chloride  3 mL Intravenous Q12H  . sodium chloride  3 mL Intravenous Q12H   Continuous Infusions:  PRN Meds:.sodium chloride, HYDROcodone-acetaminophen,  HYDROmorphone (DILAUDID) injection, LORazepam, ondansetron (ZOFRAN) IV, ondansetron, sodium chloride  Antibiotics    Anti-infectives   None      Subjective:   Jonathon Conway seen and examined today.  Patient continues to complain of back pain. He states that he has not had back surgery due to his blood thinner. Patient is having a hard time staying awake during conversation. Currently denies any further chest pain, dizziness, abdominal pain, nausea, vomiting. Patient states his breathing is improved.  Objective:   Filed Vitals:   12/15/13 2130 12/15/13 2229 12/16/13 0344 12/16/13 0550  BP: 114/69 133/73 123/58 119/69  Pulse:  77 78 79  Temp:  99.4 F (37.4 C) 98.9 F (37.2 C) 98.3 F (36.8 C)  TempSrc:  Oral Oral Oral  Resp: 16 22 18 20   Height:      Weight:      SpO2:  95% 95% 96%    Wt Readings from Last 3 Encounters:  12/15/13 74.39 kg (164 lb)  02/04/13 70.7 kg (155 lb 13.8 oz)  01/25/13 71.269 kg (157 lb 1.9 oz)     Intake/Output Summary (Last 24 hours) at 12/16/13 1414 Last data filed at 12/16/13 1242  Gross per 24 hour  Intake 490.83 ml  Output    550 ml  Net -59.17 ml    Exam  General: Well developed, well nourished, NAD, appears stated age  HEENT: NCAT, PERRLA, EOMI, Anicteic Sclera, mucous membranes moist.   Neck: Supple, no JVD, no masses  Cardiovascular: S1 S2 auscultated, no rubs, murmurs or gallops. Regular rate and rhythm.  Respiratory: Clear to auscultation bilaterally with equal chest rise  Abdomen: Soft, nontender, nondistended, + bowel sounds  Extremities: warm dry without cyanosis clubbing or edema  Neuro: Awake however very drowsy, cranial nerves grossly intact. No focal deficits  Skin: Without rashes exudates or nodules  Psych: Normal affect and demeanor with intact judgement and insight  Data Review   Micro Results No results found for this or any previous visit (from the past 240 hour(s)).  Radiology Reports Ct Abdomen  Pelvis Wo Contrast  12/15/2013   CLINICAL DATA:  Low back spasms and hematuria. History of pulmonary nodule and prostate cancer.  EXAM: CT ABDOMEN AND PELVIS WITHOUT CONTRAST  TECHNIQUE: Multidetector CT imaging of the abdomen and pelvis was performed following the standard protocol without IV contrast.  COMPARISON:  PET CTs dated 04/25/2013 and 02/11/2013. Chest CT 11/15/2013.  FINDINGS: There is new subsegmental atelectasis at both lung bases. No confluent airspace opacity or significant pleural effusion is present. There is diffuse atherosclerosis of the aorta and coronary arteries.  Again demonstrated are numerous bilateral renal calculi. There is a 14 mm right upper pole caliceal calculus. There is no hydronephrosis or ureteral calculus. Low-density  renal lesions bilaterally are grossly stable.  The liver, gallbladder, spleen, adrenal glands and pancreas appear stable without significant findings.  There is extensive atherosclerosis of the aorta, its branches and the iliac arteries. 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  The stomach, small bowel and colon demonstrate no significant findings. A left inguinal hernia has enlarged and now contains a knuckle of the sigmoid colon. There is no evidence of bowel incarceration or obstruction. Postsurgical changes are present in the right groin. Prostate brachytherapy seeds are noted. There is no pelvic lymphadenopathy. The bladder appears normal.  There is a prominent Schmorl's node and superior endplate compression deformity at L4. The stability of this finding is difficult to address based on axial images from prior PET CT, although some progression is suspected. There are no worrisome osseous findings.  IMPRESSION: 1. Numerous bilateral renal calculi. No evidence of ureteral calculus or hydronephrosis. 2. Grossly stable low density renal lesions bilaterally. 3. Stable atherosclerosis with peripherally calcified aneurysms of the splenic  and left renal arteries. 4. Enlarging left inguinal hernia containing a knuckle of the sigmoid colon. No evidence of incarceration or bowel obstruction. 5. Superior endplate compression deformity at L4, likely progressive from available prior studies.   Electronically Signed   By: Camie Patience M.D.   On: 12/15/2013 19:58   Ct Angio Chest Pe W/cm &/or Wo Cm  12/16/2013   CLINICAL DATA:  Lower back pain. Hypoxia. Evaluate for pulmonary embolus.  EXAM: CT ANGIOGRAPHY CHEST WITH CONTRAST  TECHNIQUE: Multidetector CT imaging of the chest was performed using the standard protocol during bolus administration of intravenous contrast. Multiplanar CT image reconstructions and MIPs were obtained to evaluate the vascular anatomy.  CONTRAST:  171mL OMNIPAQUE IOHEXOL 350 MG/ML SOLN  COMPARISON:  11/15/2013  FINDINGS: The heart size is normal. There is calcified atherosclerotic disease involving the thoracic aorta as well as the LAD and RCA coronary arteries. No pericardial effusion. No mediastinal or hilar adenopathy. No enlarged axillary or supraclavicular lymph nodes identified. The main pulmonary artery is normal. No lobar or segmental pulmonary artery filling defects to suggest acute pulmonary embolus.  The part solid nodule within the right upper lobe measures 1.1 x 1.3 cm, image 20/ series 6. This is unchanged from previous exam. Atelectasis is identified in both lung bases. Review of the visualized osseous structures is significant for osteopenia and multilevel spondylosis. No aggressive lytic or sclerotic bone lesions identified. No fractures identified. Next  Incidental imaging through the upper abdomen shows no acute findings.  Review of the MIP images confirms the above findings.  IMPRESSION: 1. No evidence for acute pulmonary embolus. 2. Atherosclerotic disease including coronary artery calcifications. 3. Persistent and unchanged semi-solid nodule within the right upper lobe. This remains concerning for primary  bronchogenic carcinoma (adenocarcinoma favored). Pulmonary Medicine or thoracic surgery consultation should be considered.   Electronically Signed   By: Kerby Moors M.D.   On: 12/16/2013 01:22   Dg Chest Portable 1 View  12/15/2013   CLINICAL DATA:  CHEST PAIN  EXAM: PORTABLE CHEST - 1 VIEW  COMPARISON:  Two-view chest 05/08/2013  FINDINGS: Low lung volumes. The heart size and mediastinal contours are within normal limits. Both lungs are clear. The visualized skeletal structures are unremarkable.  IMPRESSION: No active disease.   Electronically Signed   By: Margaree Mackintosh M.D.   On: 12/15/2013 15:46    CBC  Recent Labs Lab 12/15/13 1530 12/16/13 0409  WBC 10.6* 10.3  HGB  13.9 13.0  HCT 40.2 37.4*  PLT 242 213  MCV 98.0 97.9  MCH 33.9 34.0  MCHC 34.6 34.8  RDW 12.7 12.5  LYMPHSABS 1.6  --   MONOABS 1.6*  --   EOSABS 0.0  --   BASOSABS 0.0  --     Chemistries   Recent Labs Lab 12/15/13 1530 12/16/13 0409  NA 134* 129*  K 4.1 3.9  CL 94* 92*  CO2 27 26  GLUCOSE 118* 119*  BUN 12 10  CREATININE 0.65 0.68  CALCIUM 9.5 9.1  AST  --  16  ALT  --  8  ALKPHOS  --  74  BILITOT  --  0.8   ------------------------------------------------------------------------------------------------------------------ estimated creatinine clearance is 77.3 ml/min (by C-G formula based on Cr of 0.68). ------------------------------------------------------------------------------------------------------------------ No results found for this basename: HGBA1C,  in the last 72 hours ------------------------------------------------------------------------------------------------------------------ No results found for this basename: CHOL, HDL, LDLCALC, TRIG, CHOLHDL, LDLDIRECT,  in the last 72 hours ------------------------------------------------------------------------------------------------------------------ No results found for this basename: TSH, T4TOTAL, FREET3, T3FREE, THYROIDAB,  in the  last 72 hours ------------------------------------------------------------------------------------------------------------------ No results found for this basename: VITAMINB12, FOLATE, FERRITIN, TIBC, IRON, RETICCTPCT,  in the last 72 hours  Coagulation profile No results found for this basename: INR, PROTIME,  in the last 168 hours  No results found for this basename: DDIMER,  in the last 72 hours  Cardiac Enzymes  Recent Labs Lab 12/15/13 2207 12/16/13 0409 12/16/13 0956  TROPONINI <0.30 <0.30 <0.30   ------------------------------------------------------------------------------------------------------------------ No components found with this basename: POCBNP,     Bunnie Lederman D.O. on 12/16/2013 at 2:14 PM  Between 7am to 7pm - Pager - [phone]  After 7pm go to [email or web address] - password [email or web address]  And look for the night coverage person covering for me after hours  [facility] Office  [phone]

## 2013-12-16 NOTE — Progress Notes (Signed)
The patient is receiving Robaxin by the intravenous route.  Based on criteria approved by the Pharmacy and [place], the medication is being converted to the equivalent oral dose form.  These criteria include: -No Active GI bleeding -Able to tolerate diet of full liquids (or better) or tube feeding OR able to tolerate other medications by the oral or enteral route  If you have any questions about this conversion, please contact the Pharmacy Department (ext [phone]).  Thank you.  Biagio Borg, [place] 12/16/2013 12:21 PM

## 2013-12-17 LAB — BASIC METABOLIC PANEL
Anion gap: 9 (ref 5–15)
BUN: 11 mg/dL (ref 6–23)
CHLORIDE: 99 meq/L (ref 96–112)
CO2: 26 meq/L (ref 19–32)
CREATININE: 0.61 mg/dL (ref 0.50–1.35)
Calcium: 9 mg/dL (ref 8.4–10.5)
GFR calc Af Amer: >90 mL/min (ref >90)
GFR calc non Af Amer: >90 mL/min (ref >90)
Glucose, Bld: 98 mg/dL (ref 70–99)
Potassium: 3.9 mEq/L (ref 3.7–5.3)
Sodium: 134 mEq/L — ABNORMAL LOW (ref 137–147)

## 2013-12-17 LAB — CBC
HEMATOCRIT: 34.6 % — AB (ref 39.0–52.0)
HEMOGLOBIN: 12 g/dL — AB (ref 13.0–17.0)
MCH: 33.9 pg (ref 26.0–34.0)
MCHC: 34.7 g/dL (ref 30.0–36.0)
MCV: 97.7 fL (ref 78.0–100.0)
Platelets: 209 10*3/uL (ref 150–400)
RBC: 3.54 MIL/uL — ABNORMAL LOW (ref 4.22–5.81)
RDW: 12.3 % (ref 11.5–15.5)
WBC: 8.9 10*3/uL (ref 4.0–10.5)

## 2013-12-17 NOTE — Progress Notes (Addendum)
[facility]                                                                              Patient Demographics  Jonathon Conway, is a 80 y.o. male, DOB - December 27, 1931, PTW:[id number]  Admit date - 12/15/2013   Admitting Physician Oswald Hillock, MD  Outpatient Primary MD for the patient is Sherrie Mustache, MD  LOS - 2   Chief Complaint  Patient presents with  . Chest Pain      HPI 80 year old male with history of hypertension, hyperlipidemia, depression, prostate cancer and pulmonary emboli on Zoloft who presents emergency department with complaints of right lower back pain and spasm which started yesterday. Patient reports no injury or fall or trauma. He does have a history of back problems and has been followed by [facility] orthopedics. Patient states he's had a pinched nerve which caused radiation of the pain in the left buttock in the past but currently the pain is located in the right lower back and radiates upwards. The pain becomes worse with movement. Patient denied dysuria but does have urgency of urination due to his prostate surgery. He does have recent history of pulmonary embolism and was started on Xarelto. Patient also complained of left-sided chest pain denies any nausea, vomiting, diarrhea. Patient also has a history of a lung nodule in the right upper lung and is scheduled for surgery on 12/23/2013. In the emergency department, CT of the abdomen and pelvis was done which shows a compression at the L4 superior endplate. Patient did receive Valium 5 mg along with morphine 4 mg and after that he had a brief episode of hypoxia with O2 saturations dropping 85%.  Assessment & Plan   Intractable back pain -Patient has had a long-standing history of back pain however states this is the worse he's had. -CT of the abdomen and pelvis shows inferior endplate compression deformity at L4, likely progressive from the available prior studies -MRI ordered however cannot be done until  Monday -Spoke with Dr. Alvan Dame, orthopedic surgeon on call at [facility] cone recommended kyphoplasty by IR for outpatient followup with Dr. Rolena Infante. -Will continue pain management with robaxin, PRN dialudid and norco -PT consulted   Chest pain -Troponins remained negative -Patient currently chest pain-free  History of pulmonary embolism -On anticoagulation however this will need to be held due to his upcoming surgery -CT angiogram showed no acute pulmonary embolism  Acute episode of hypoxia -Likely secondary to medication including Valium as well as morphine -This has since resolved since patient has been placed on nasal cannula -Will try to wean off of nasal canula  Altered mental status -Likely secondary to her pain medication -UA negative for infection, chest x-ray showed no consolidation -Will continue to monitor  Hypertension -Currently controlled, continue amlodipine  History of lung nodule -Patient scheduled for surgery on 12/23/2013 at [facility] will need to be held for 7 days prior to surgery  Hyponatremia/hypochloremia -Improved, continue IVF and continue to monitor BMP  Depression -Continue lexapro  Code Status: Full  Family Communication: None at bedside  Disposition Plan: Admitted  Time Spent in minutes   25 minutes  Procedures  None  Consults  Dr. Alvan Dame, orthopedics, via phone  DVT Prophylaxis  Xarelto  Lab Results  Component Value Date   PLT 209 12/17/2013    Medications  Scheduled Meds: . allopurinol  100 mg Oral q morning - 10a  . amLODipine  5 mg Oral q morning - 10a  . cholecalciferol  1,000 Units Oral q morning - 10a  . escitalopram  10 mg Oral q morning - 10a  . methocarbamol  500 mg Oral TID  . pantoprazole  80 mg Oral Daily  . simvastatin  20 mg Oral q1800  . sodium chloride  3 mL Intravenous Q12H   Continuous Infusions: . sodium chloride 75 mL/hr at 12/17/13 0656   PRN Meds:.HYDROcodone-acetaminophen,  HYDROmorphone (DILAUDID) injection, LORazepam, ondansetron (ZOFRAN) IV, ondansetron  Antibiotics    Anti-infectives   None      Subjective:   [name] seen and examined today.  Patient continues to complain of back pain and spasms. He complains of feeling weak.  Currently denies any further chest pain, dizziness, abdominal pain, nausea, vomiting.  Objective:   Filed Vitals:   12/16/13 0344 12/16/13 0550 12/16/13 2212 12/17/13 0609  BP: 123/58 119/69 123/71 139/77  Pulse: 78 79 77 70  Temp: 98.9 F (37.2 C) 98.3 F (36.8 C) 99.2 F (37.3 C) 98 F (36.7 C)  TempSrc: Oral Oral Oral Oral  Resp: 18 20  20   Height:      Weight:      SpO2: 95% 96% 97% 95%    Wt Readings from Last 3 Encounters:  12/15/13 74.39 kg (164 lb)  02/04/13 70.7 kg (155 lb 13.8 oz)  01/25/13 71.269 kg (157 lb 1.9 oz)     Intake/Output Summary (Last 24 hours) at 12/17/13 1239 Last data filed at 12/17/13 0900  Gross per 24 hour  Intake 2103.75 ml  Output    350 ml  Net 1753.75 ml    Exam  General: Well developed, well nourished, NAD, appears stated age  HEENT: NCAT, mucous membranes moist.   Neck: Supple, no JVD, no masses  Cardiovascular: S1 S2 auscultated, no rubs, murmurs or gallops. Regular rate and rhythm.  Respiratory: Clear to auscultation bilaterally with equal chest rise  Abdomen: Soft, nontender, nondistended, + bowel sounds  Extremities: warm dry without cyanosis clubbing or edema  Neuro: AAOx3, No focal deficits  Skin: Without rashes exudates or nodules  Psych: Normal affect and demeanor with intact judgement and insight  Data Review   Micro Results No results found for this or any previous visit (from the past 240 hour(s)).  Radiology Reports Ct Abdomen Pelvis Wo Contrast  12/15/2013   CLINICAL DATA:  Low back spasms and hematuria. History of pulmonary nodule and prostate cancer.  EXAM: CT ABDOMEN AND PELVIS WITHOUT CONTRAST  TECHNIQUE: Multidetector CT imaging  of the abdomen and pelvis was performed following the standard protocol without IV contrast.  COMPARISON:  PET CTs dated 04/25/2013 and 02/11/2013. Chest CT 11/15/2013.  FINDINGS: There is new subsegmental atelectasis at both lung bases. No confluent airspace opacity or significant pleural effusion is present. There is diffuse atherosclerosis of the aorta and coronary arteries.  Again demonstrated are numerous bilateral renal calculi. There is a 14 mm right upper pole caliceal calculus. There is no hydronephrosis or ureteral calculus. Low-density renal lesions bilaterally are grossly stable.  The liver, gallbladder, spleen, adrenal glands and pancreas appear stable without significant findings.  There is extensive atherosclerosis of the aorta, its branches and the iliac arteries. 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  The stomach, small bowel and colon demonstrate no significant findings. A left inguinal hernia has enlarged and now contains a knuckle of the sigmoid colon. There is no evidence of bowel incarceration or obstruction. Postsurgical changes are present in the right groin. Prostate brachytherapy seeds are noted. There is no pelvic lymphadenopathy. The bladder appears normal.  There is a prominent Schmorl's node and superior endplate compression deformity at L4. The stability of this finding is difficult to address based on axial images from prior PET CT, although some progression is suspected. There are no worrisome osseous findings.  IMPRESSION: 1. Numerous bilateral renal calculi. No evidence of ureteral calculus or hydronephrosis. 2. Grossly stable low density renal lesions bilaterally. 3. 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 4. Enlarging left inguinal hernia containing a knuckle of the sigmoid colon. No evidence of incarceration or bowel obstruction. 5. Superior endplate compression deformity at L4, likely  progressive from available prior studies.   Electronically Signed   By: Camie Patience M.D.   On: 12/15/2013 19:58   Ct Angio Chest Pe W/cm &/or Wo Cm  12/16/2013   CLINICAL DATA:  Lower back pain. Hypoxia. Evaluate for pulmonary embolus.  EXAM: CT ANGIOGRAPHY CHEST WITH CONTRAST  TECHNIQUE: Multidetector CT imaging of the chest was performed using the standard protocol during bolus administration of intravenous contrast. Multiplanar CT image reconstructions and MIPs were obtained to evaluate the vascular anatomy.  CONTRAST:  182mL OMNIPAQUE IOHEXOL 350 MG/ML SOLN  COMPARISON:  11/15/2013  FINDINGS: The heart size is normal. There is calcified atherosclerotic disease involving the thoracic aorta as well as the LAD and RCA coronary arteries. No pericardial effusion. No mediastinal or hilar adenopathy. No enlarged axillary or supraclavicular lymph nodes identified. The main pulmonary artery is normal. No lobar or segmental pulmonary artery filling defects to suggest acute pulmonary embolus.  The part solid nodule within the right upper lobe measures 1.1 x 1.3 cm, image 20/ series 6. This is unchanged from previous exam. Atelectasis is identified in both lung bases. Review of the visualized osseous structures is significant for osteopenia and multilevel spondylosis. No aggressive lytic or sclerotic bone lesions identified. No fractures identified. Next  Incidental imaging through the upper abdomen shows no acute findings.  Review of the MIP images confirms the above findings.  IMPRESSION: 1. No evidence for acute pulmonary embolus. 2. Atherosclerotic disease including coronary artery calcifications. 3. Persistent and unchanged semi-solid nodule within the right upper lobe. This remains concerning for primary bronchogenic carcinoma (adenocarcinoma favored). Pulmonary Medicine or thoracic surgery consultation should be considered.   Electronically Signed   By: Kerby Moors M.D.   On: 12/16/2013 01:22   Dg Chest  Portable 1 View  12/15/2013   CLINICAL DATA:  CHEST PAIN  EXAM: PORTABLE CHEST - 1 VIEW  COMPARISON:  Two-view chest 05/08/2013  FINDINGS: Low lung volumes. The heart size and mediastinal contours are within normal limits. Both lungs are clear. The visualized skeletal structures are unremarkable.  IMPRESSION: No active disease.   Electronically Signed   By: Margaree Mackintosh M.D.   On: 12/15/2013 15:46    CBC  Recent Labs Lab 12/15/13 1530 12/16/13 0409 12/17/13 0819  WBC 10.6* 10.3 8.9  HGB 13.9 13.0 12.0*  HCT 40.2 37.4* 34.6*  PLT 242 213 209  MCV 98.0 97.9 97.7  MCH 33.9 34.0 33.9  MCHC 34.6 34.8 34.7  RDW 12.7 12.5 12.3  LYMPHSABS 1.6  --   --   MONOABS 1.6*  --   --   EOSABS 0.0  --   --   BASOSABS 0.0  --   --     Chemistries   Recent Labs Lab 12/15/13 1530 12/16/13 0409 12/17/13 0819  NA 134* 129* 134*  K 4.1 3.9 3.9  CL 94* 92* 99  CO2 27 26 26   GLUCOSE 118* 119* 98  BUN 12 10 11   CREATININE 0.65 0.68 0.61  CALCIUM 9.5 9.1 9.0  AST  --  16  --   ALT  --  8  --   ALKPHOS  --  74  --   BILITOT  --  0.8  --    ------------------------------------------------------------------------------------------------------------------ estimated creatinine clearance is 77.3 ml/min (by C-G formula based on Cr of 0.61). ------------------------------------------------------------------------------------------------------------------ No results found for this basename: HGBA1C,  in the last 72 hours ------------------------------------------------------------------------------------------------------------------ No results found for this basename: CHOL, HDL, LDLCALC, TRIG, CHOLHDL, LDLDIRECT,  in the last 72 hours ------------------------------------------------------------------------------------------------------------------ No results found for this basename: TSH, T4TOTAL, FREET3, T3FREE, THYROIDAB,  in the last 72  hours ------------------------------------------------------------------------------------------------------------------ No results found for this basename: VITAMINB12, FOLATE, FERRITIN, TIBC, IRON, RETICCTPCT,  in the last 72 hours  Coagulation profile No results found for this basename: INR, PROTIME,  in the last 168 hours  No results found for this basename: DDIMER,  in the last 72 hours  Cardiac Enzymes  Recent Labs Lab 12/15/13 2207 12/16/13 0409 12/16/13 0956  TROPONINI <0.30 <0.30 <0.30   ------------------------------------------------------------------------------------------------------------------ No components found with this basename: POCBNP,     Akira Adelsberger D.O. on 12/17/2013 at 12:39 PM  Between 7am to 7pm - Pager - [phone]  After 7pm go to [email or web address] - password [email or web address]  And look for the night coverage person covering for me after hours  [facility] Office  [phone]

## 2013-12-18 LAB — CBC
HEMATOCRIT: 35.9 % — AB (ref 39.0–52.0)
Hemoglobin: 12.3 g/dL — ABNORMAL LOW (ref 13.0–17.0)
MCH: 33.4 pg (ref 26.0–34.0)
MCHC: 34.3 g/dL (ref 30.0–36.0)
MCV: 97.6 fL (ref 78.0–100.0)
PLATELETS: 241 10*3/uL (ref 150–400)
RBC: 3.68 MIL/uL — AB (ref 4.22–5.81)
RDW: 12.2 % (ref 11.5–15.5)
WBC: 7.6 10*3/uL (ref 4.0–10.5)

## 2013-12-18 LAB — BASIC METABOLIC PANEL
ANION GAP: 11 (ref 5–15)
BUN: 5 mg/dL — ABNORMAL LOW (ref 6–23)
CALCIUM: 9.2 mg/dL (ref 8.4–10.5)
CO2: 26 mEq/L (ref 19–32)
CREATININE: 0.52 mg/dL (ref 0.50–1.35)
Chloride: 99 mEq/L (ref 96–112)
GFR calc Af Amer: >90 mL/min (ref >90)
GFR calc non Af Amer: >90 mL/min (ref >90)
Glucose, Bld: 93 mg/dL (ref 70–99)
Potassium: 3.8 mEq/L (ref 3.7–5.3)
Sodium: 136 mEq/L — ABNORMAL LOW (ref 137–147)

## 2013-12-18 MED ORDER — DOCUSATE SODIUM 100 MG PO CAPS
100.0000 mg | ORAL_CAPSULE | Freq: Two times a day (BID) | ORAL | Status: DC | PRN
  Administered 2013-12-18: 100 mg via ORAL
  Filled 2013-12-18: qty 1

## 2013-12-18 MED ORDER — POLYETHYLENE GLYCOL 3350 17 G PO PACK: 17.0000 g | PACK | Freq: Every day | ORAL | Status: DC | PRN

## 2013-12-18 NOTE — Progress Notes (Signed)
[facility]                                                                              Patient Demographics  Jonathon Conway, is a 80 y.o. male, DOB - 1931/09/28, QQV:[id number]  Admit date - 12/15/2013   Admitting Physician Oswald Hillock, MD  Outpatient Primary MD for the patient is Sherrie Mustache, MD  LOS - 3   Chief Complaint  Patient presents with  . Chest Pain      HPI 80 year old male with history of hypertension, hyperlipidemia, depression, prostate cancer and pulmonary emboli on Zoloft who presents emergency department with complaints of right lower back pain and spasm which started yesterday. Patient reports no injury or fall or trauma. He does have a history of back problems and has been followed by [facility] orthoped[facility]. Patient states he's had a pinched nerve which caused radiation of the pain in the left buttock in the past but currently the pain is located in the right lower back and radiates upwards. The pain becomes worse with movement. Patient denied dysuria but does have urgency of urination due to his prostate surgery. He does have recent history of pulmonary embolism and was started on Xarelto. Patient also complained of left-sided chest pain denies any nausea, vomiting, diarrhea. Patient also has a history of a lung nodule in the right upper lung and is scheduled for surgery on 12/23/2013. In the emergency department, CT of the abdomen and pelvis was done which shows a compression at the L4 superior endplate. Patient did receive Valium 5 mg along with morphine 4 mg and after that he had a brief episode of hypoxia with O2 saturations dropping 85%.  Assessment & Plan   Intractable back pain -Patient has had a long-standing history of back pain however states this is the worse he's had. -CT of the abdomen and pelvis shows inferior endplate compression deformity at L4, likely progressive from the available prior studies -MRI ordered however cannot be done until  Monday -Spoke with Dr. Alvan Dame, orthopedic surgeon on call at [facility] cone recommended kyphoplasty by IR for outpatient followup with Dr. Rolena Infante. -Will continue pain management with robaxin, PRN dialudid and norco -PT consulted   Chest pain -Troponins remained negative -Patient currently chest pain-free  History of pulmonary embolism -On anticoagulation however this will need to be held due to his upcoming surgery -CT angiogram showed no acute pulmonary embolism  Acute episode of hypoxia -Resolved, Likely secondary to medication including Valium as well as morphine  Altered mental status -Resolved, Likely secondary to pain medication -UA negative for infection, chest x-ray showed no consolidation  Hypertension -Currently controlled, continue amlodipine  History of lung nodule -Patient scheduled for surgery on 12/23/2013 at [facility] will need to be held for 7 days prior to surgery  Hyponatremia/hypochloremia -Improving, continue IVF and continue to monitor BMP  Depression -Continue lexapro  Code Status: Full  Family Communication: None at bedside  Disposition Plan: Admitted.  Pending MRI of lumbar spine on 7/6.    Time Spent in minutes   25 minutes  Procedures  None  Consults   Dr. Alvan Dame, orthopedics, via phone  DVT Prophylaxis  Xarelto  Lab Results  Component Value Date   PLT 241 12/18/2013    Medications  Scheduled Meds: . allopurinol  100 mg Oral q morning - 10a  . amLODipine  5 mg Oral q morning - 10a  . cholecalciferol  1,000 Units Oral q morning - 10a  . escitalopram  10 mg Oral q morning - 10a  . methocarbamol  500 mg Oral TID  . pantoprazole  80 mg Oral Daily  . simvastatin  20 mg Oral q1800  . sodium chloride  3 mL Intravenous Q12H   Continuous Infusions: . sodium chloride 75 mL/hr at 12/18/13 0639   PRN Meds:.HYDROcodone-acetaminophen, HYDROmorphone (DILAUDID) injection, LORazepam, ondansetron (ZOFRAN) IV,  ondansetron  Antibiotics    Anti-infectives   None      Subjective:   Jonathon Conway seen and examined today.  Patient continues to complain of back pain and spasms, however states it has "slightly improved."  Currently denies any further chest pain, dizziness, abdominal pain, nausea, vomiting. Patient feels he should postpone his upcoming surgery.   Objective:   Filed Vitals:   12/17/13 0609 12/17/13 1635 12/17/13 2043 12/18/13 0445  BP: 139/77 137/71 129/72 148/66  Pulse: 70 72 67 63  Temp: 98 F (36.7 C) 98.1 F (36.7 C) 98.2 F (36.8 C) 98.1 F (36.7 C)  TempSrc: Oral  Oral Oral  Resp: 20 20 18 20   Height:      Weight:      SpO2: 95% 97% 96% 98%    Wt Readings from Last 3 Encounters:  12/15/13 74.39 kg (164 lb)  02/04/13 70.7 kg (155 lb 13.8 oz)  01/25/13 71.269 kg (157 lb 1.9 oz)     Intake/Output Summary (Last 24 hours) at 12/18/13 1038 Last data filed at 12/18/13 4332  Gross per 24 hour  Intake   1229 ml  Output   2325 ml  Net  -1096 ml    Exam  General: Well developed, well nourished, NAD, appears stated age  HEENT: NCAT, mucous membranes moist.   Neck: Supple, no JVD, no masses  Cardiovascular: S1 S2 auscultated, no rubs, murmurs or gallops. Regular rate and rhythm.  Respiratory: Clear to auscultation bilaterally with equal chest rise  Abdomen: Soft, nontender, nondistended, + bowel sounds  Extremities: warm dry without cyanosis clubbing or edema  Neuro: AAOx3, No focal deficits  Skin: Without rashes exudates or nodules  Psych: Appropriate mood and affect, slightly anxious  Data Review   Micro Results No results found for this or any previous visit (from the past 240 hour(s)).  Radiology Reports Ct Abdomen Pelvis Wo Contrast  12/15/2013   CLINICAL DATA:  Low back spasms and hematuria. History of pulmonary nodule and prostate cancer.  EXAM: CT ABDOMEN AND PELVIS WITHOUT CONTRAST  TECHNIQUE: Multidetector CT imaging of the abdomen and  pelvis was performed following the standard protocol without IV contrast.  COMPARISON:  PET CTs dated 04/25/2013 and 02/11/2013. Chest CT 11/15/2013.  FINDINGS: There is new subsegmental atelectasis at both lung bases. No confluent airspace opacity or significant pleural effusion is present. There is diffuse atherosclerosis of the aorta and coronary arteries.  Again demonstrated are numerous bilateral renal calculi. There is a 14 mm right upper pole caliceal calculus. There is no hydronephrosis or ureteral calculus. Low-density renal lesions bilaterally are grossly stable.  The liver, gallbladder, spleen, adrenal glands and pancreas appear stable without significant findings.  There is extensive atherosclerosis of the aorta, its branches and the iliac arteries. 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  The stomach, small bowel and colon demonstrate no significant findings. A left inguinal hernia has enlarged and now contains a knuckle of the sigmoid colon. There is no evidence of bowel incarceration or obstruction. Postsurgical changes are present in the right groin. Prostate brachytherapy seeds are noted. There is no pelvic lymphadenopathy. The bladder appears normal.  There is a prominent Schmorl's node and superior endplate compression deformity at L4. The stability of this finding is difficult to address based on axial images from prior PET CT, although some progression is suspected. There are no worrisome osseous findings.  IMPRESSION: 1. Numerous bilateral renal calculi. No evidence of ureteral calculus or hydronephrosis. 2. Grossly stable low density renal lesions bilaterally. 3. 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 4. Enlarging left inguinal hernia containing a knuckle of the sigmoid colon. No evidence of incarceration or bowel obstruction. 5. Superior endplate compression deformity at L4, likely progressive from  available prior studies.   Electronically Signed   By: Camie Patience M.D.   On: 12/15/2013 19:58   Ct Angio Chest Pe W/cm &/or Wo Cm  12/16/2013   CLINICAL DATA:  Lower back pain. Hypoxia. Evaluate for pulmonary embolus.  EXAM: CT ANGIOGRAPHY CHEST WITH CONTRAST  TECHNIQUE: Multidetector CT imaging of the chest was performed using the standard protocol during bolus administration of intravenous contrast. Multiplanar CT image reconstructions and MIPs were obtained to evaluate the vascular anatomy.  CONTRAST:  186mL OMNIPAQUE IOHEXOL 350 MG/ML SOLN  COMPARISON:  11/15/2013  FINDINGS: The heart size is normal. There is calcified atherosclerotic disease involving the thoracic aorta as well as the LAD and RCA coronary arteries. No pericardial effusion. No mediastinal or hilar adenopathy. No enlarged axillary or supraclavicular lymph nodes identified. The main pulmonary artery is normal. No lobar or segmental pulmonary artery filling defects to suggest acute pulmonary embolus.  The part solid nodule within the right upper lobe measures 1.1 x 1.3 cm, image 20/ series 6. This is unchanged from previous exam. Atelectasis is identified in both lung bases. Review of the visualized osseous structures is significant for osteopenia and multilevel spondylosis. No aggressive lytic or sclerotic bone lesions identified. No fractures identified. Next  Incidental imaging through the upper abdomen shows no acute findings.  Review of the MIP images confirms the above findings.  IMPRESSION: 1. No evidence for acute pulmonary embolus. 2. Atherosclerotic disease including coronary artery calcifications. 3. Persistent and unchanged semi-solid nodule within the right upper lobe. This remains concerning for primary bronchogenic carcinoma (adenocarcinoma favored). Pulmonary Medicine or thoracic surgery consultation should be considered.   Electronically Signed   By: Kerby Moors M.D.   On: 12/16/2013 01:22   Dg Chest Portable 1  View  12/15/2013   CLINICAL DATA:  CHEST PAIN  EXAM: PORTABLE CHEST - 1 VIEW  COMPARISON:  Two-view chest 05/08/2013  FINDINGS: Low lung volumes. The heart size and mediastinal contours are within normal limits. Both lungs are clear. The visualized skeletal structures are unremarkable.  IMPRESSION: No active disease.   Electronically Signed   By: Margaree Mackintosh M.D.   On: 12/15/2013 15:46    CBC  Recent Labs Lab 12/15/13 1530 12/16/13 0409 12/17/13 0819 12/18/13 0604  WBC 10.6* 10.3 8.9 7.6  HGB 13.9 13.0 12.0* 12.3*  HCT 40.2 37.4* 34.6* 35.9*  PLT 242 213 209 241  MCV 98.0 97.9 97.7 97.6  MCH 33.9 34.0 33.9 33.4  MCHC 34.6 34.8 34.7  34.3  RDW 12.7 12.5 12.3 12.2  LYMPHSABS 1.6  --   --   --   MONOABS 1.6*  --   --   --   EOSABS 0.0  --   --   --   BASOSABS 0.0  --   --   --     Chemistries   Recent Labs Lab 12/15/13 1530 12/16/13 0409 12/17/13 0819 12/18/13 0604  NA 134* 129* 134* 136*  K 4.1 3.9 3.9 3.8  CL 94* 92* 99 99  CO2 27 26 26 26   GLUCOSE 118* 119* 98 93  BUN 12 10 11  5*  CREATININE 0.65 0.68 0.61 0.52  CALCIUM 9.5 9.1 9.0 9.2  AST  --  16  --   --   ALT  --  8  --   --   ALKPHOS  --  74  --   --   BILITOT  --  0.8  --   --    ------------------------------------------------------------------------------------------------------------------ estimated creatinine clearance is 77.3 ml/min (by C-G formula based on Cr of 0.52). ------------------------------------------------------------------------------------------------------------------ No results found for this basename: HGBA1C,  in the last 72 hours ------------------------------------------------------------------------------------------------------------------ No results found for this basename: CHOL, HDL, LDLCALC, TRIG, CHOLHDL, LDLDIRECT,  in the last 72 hours ------------------------------------------------------------------------------------------------------------------ No results found for this  basename: TSH, T4TOTAL, FREET3, T3FREE, THYROIDAB,  in the last 72 hours ------------------------------------------------------------------------------------------------------------------ No results found for this basename: VITAMINB12, FOLATE, FERRITIN, TIBC, IRON, RETICCTPCT,  in the last 72 hours  Coagulation profile No results found for this basename: INR, PROTIME,  in the last 168 hours  No results found for this basename: DDIMER,  in the last 72 hours  Cardiac Enzymes  Recent Labs Lab 12/15/13 2207 12/16/13 0409 12/16/13 0956  TROPONINI <0.30 <0.30 <0.30   ------------------------------------------------------------------------------------------------------------------ No components found with this basename: POCBNP,     Selene Peltzer D.O. on 12/18/2013 at 10:38 AM  Between 7am to 7pm - Pager - [phone]  After 7pm go to [email or web address] - password [email or web address]  And look for the night coverage person covering for me after hours  [facility] Office  [phone]

## 2013-12-18 NOTE — Progress Notes (Signed)
Patient has not had BM since 12/14/2013.  Patient is passing gas.  Requests something for constipation.  Dr. Ree Kida notified via text page.

## 2013-12-19 ENCOUNTER — Inpatient Hospital Stay (HOSPITAL_COMMUNITY): Payer: Medicare Other

## 2013-12-19 LAB — BASIC METABOLIC PANEL
ANION GAP: 12 (ref 5–15)
BUN: 4 mg/dL — AB (ref 6–23)
CALCIUM: 9.4 mg/dL (ref 8.4–10.5)
CO2: 26 meq/L (ref 19–32)
CREATININE: 0.55 mg/dL (ref 0.50–1.35)
Chloride: 100 mEq/L (ref 96–112)
GFR calc Af Amer: >90 mL/min (ref >90)
GFR calc non Af Amer: >90 mL/min (ref >90)
GLUCOSE: 95 mg/dL (ref 70–99)
Potassium: 3.7 mEq/L (ref 3.7–5.3)
Sodium: 138 mEq/L (ref 137–147)

## 2013-12-19 LAB — PROTIME-INR
INR: 1.23 (ref 0.00–1.49)
Prothrombin Time: 15.5 seconds — ABNORMAL HIGH (ref 11.6–15.2)

## 2013-12-19 LAB — APTT: aPTT: 43 seconds — ABNORMAL HIGH (ref 24–37)

## 2013-12-19 MED ORDER — CEFAZOLIN SODIUM-DEXTROSE 2-3 GM-% IV SOLR
2.0000 g | Freq: Once | INTRAVENOUS | Status: DC
  Filled 2013-12-19: qty 50

## 2013-12-19 MED ORDER — GADOBENATE DIMEGLUMINE 529 MG/ML IV SOLN
15.0000 mL | Freq: Once | INTRAVENOUS | Status: AC | PRN
  Administered 2013-12-19: 15 mL via INTRAVENOUS

## 2013-12-19 NOTE — Progress Notes (Signed)
[facility]                                                                              Patient Demographics  Jonathon Conway, is a 80 y.o. male, DOB - 1932/02/05, XTG:[id number]  Admit date - 12/15/2013   Admitting Physician Oswald Hillock, MD  Outpatient Primary MD for the patient is Sherrie Mustache, MD  LOS - 4   Chief Complaint  Patient presents with  . Chest Pain      HPI 80 year old male with history of hypertension, hyperlipidemia, depression, prostate cancer and pulmonary emboli on Zoloft who presents emergency department with complaints of right lower back pain and spasm which started yesterday. Patient reports no injury or fall or trauma. He does have a history of back problems and has been followed by [facility] orthopedics. Patient states he's had a pinched nerve which caused radiation of the pain in the left buttock in the past but currently the pain is located in the right lower back and radiates upwards. The pain becomes worse with movement. Patient denied dysuria but does have urgency of urination due to his prostate surgery. He does have recent history of pulmonary embolism and was started on Xarelto. Patient also complained of left-sided chest pain denies any nausea, vomiting, diarrhea. Patient also has a history of a lung nodule in the right upper lung and is scheduled for surgery on 12/23/2013. In the emergency department, CT of the abdomen and pelvis was done which shows a compression at the L4 superior endplate. Patient did receive Valium 5 mg along with morphine 4 mg and after that he had a brief episode of hypoxia with O2 saturations dropping 85%.  Assessment & Plan   Intractable back pain -Patient has had a long-standing history of back pain however states this is the worse he's had. -CT of the abdomen and pelvis shows inferior endplate compression deformity at L4, likely progressive from the available prior studies -MRI of lumbar spine: Acute compression  fracture of L4 -IR consulted and appreciate, kyphoplasty on 7/7. -Will continue pain management with robaxin, PRN dialudid and norco -PT consulted and recommended outpatient PT  Chest pain -Troponins remained negative -Patient currently chest pain-free  History of pulmonary embolism -On anticoagulation however this will need to be held due to his upcoming surgery -CT angiogram showed no acute pulmonary embolism  Acute episode of hypoxia -Resolved, Likely secondary to medication including Valium as well as morphine  Altered mental status -Resolved, Likely secondary to pain medication -UA negative for infection, chest x-ray showed no consolidation  Hypertension -Currently controlled, continue amlodipine  History of lung nodule -Patient scheduled for surgery on 12/23/2013 at [place] will need to be held for 7 days prior to surgery  Hyponatremia/hypochloremia -Improving, continue IVF and continue to monitor BMP  Depression -Continue lexapro  Code Status: Full  Family Communication: None at bedside  Disposition Plan: Admitted.  Kyphoplasty at [facility] by IR on 12/20/2013.   Time Spent in minutes   25 minutes  Procedures  None  Consults   Dr. Alvan Dame, orthopedics, via phone  DVT Prophylaxis  Xarelto  Lab Results  Component Value Date   PLT 241 12/18/2013  Medications  Scheduled Meds: . allopurinol  100 mg Oral q morning - 10a  . amLODipine  5 mg Oral q morning - 10a  . cholecalciferol  1,000 Units Oral q morning - 10a  . escitalopram  10 mg Oral q morning - 10a  . methocarbamol  500 mg Oral TID  . pantoprazole  80 mg Oral Daily  . simvastatin  20 mg Oral q1800  . sodium chloride  3 mL Intravenous Q12H   Continuous Infusions: . sodium chloride 75 mL/hr at 12/19/13 1013   PRN Meds:.docusate sodium, HYDROcodone-acetaminophen, HYDROmorphone (DILAUDID) injection, LORazepam, ondansetron (ZOFRAN) IV, ondansetron, polyethylene  glycol  Antibiotics    Anti-infectives   None      Subjective:   Jonathon Conway seen and examined today.  Patient feels well this morning and wonders when he will go home.  Patient states his back pain has improved.  He is worried about his upcoming procedure on 7/10.  Objective:   Filed Vitals:   12/18/13 1130 12/18/13 1444 12/18/13 2100 12/19/13 0613  BP:  124/66 132/58 137/75  Pulse:  68 72 59  Temp:  98.1 F (36.7 C) 98.6 F (37 C) 97.5 F (36.4 C)  TempSrc:   Oral Oral  Resp:  20 20 20   Height:      Weight:      SpO2: 94% 92% 94% 94%    Wt Readings from Last 3 Encounters:  12/15/13 74.39 kg (164 lb)  02/04/13 70.7 kg (155 lb 13.8 oz)  01/25/13 71.269 kg (157 lb 1.9 oz)     Intake/Output Summary (Last 24 hours) at 12/19/13 1419 Last data filed at 12/19/13 0900  Gross per 24 hour  Intake   3635 ml  Output   3250 ml  Net    385 ml    Exam  General: Well developed, well nourished, NAD, appears stated age  HEENT: NCAT, mucous membranes moist.   Neck: Supple, no JVD, no masses  Cardiovascular: S1 S2 auscultated, no rubs, murmurs or gallops. Regular rate and rhythm.  Respiratory: Clear to auscultation bilaterally with equal chest rise  Abdomen: Soft, nontender, nondistended, + bowel sounds  Extremities: warm dry without cyanosis clubbing or edema  Neuro: AAOx3, No focal deficits  Skin: Without rashes exudates or nodules  Psych: Appropriate mood and affect, slightly anxious  Data Review   Micro Results No results found for this or any previous visit (from the past 240 hour(s)).  Radiology Reports Ct Abdomen Pelvis Wo Contrast  12/15/2013   CLINICAL DATA:  Low back spasms and hematuria. History of pulmonary nodule and prostate cancer.  EXAM: CT ABDOMEN AND PELVIS WITHOUT CONTRAST  TECHNIQUE: Multidetector CT imaging of the abdomen and pelvis was performed following the standard protocol without IV contrast.  COMPARISON:  PET CTs dated 04/25/2013 and  02/11/2013. Chest CT 11/15/2013.  FINDINGS: There is new subsegmental atelectasis at both lung bases. No confluent airspace opacity or significant pleural effusion is present. There is diffuse atherosclerosis of the aorta and coronary arteries.  Again demonstrated are numerous bilateral renal calculi. There is a 14 mm right upper pole caliceal calculus. There is no hydronephrosis or ureteral calculus. Low-density renal lesions bilaterally are grossly stable.  The liver, gallbladder, spleen, adrenal glands and pancreas appear stable without significant findings.  There is extensive atherosclerosis of the aorta, its branches and the iliac arteries. 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  The stomach, small bowel and colon demonstrate no significant findings.  A left inguinal hernia has enlarged and now contains a knuckle of the sigmoid colon. There is no evidence of bowel incarceration or obstruction. Postsurgical changes are present in the right groin. Prostate brachytherapy seeds are noted. There is no pelvic lymphadenopathy. The bladder appears normal.  There is a prominent Schmorl's node and superior endplate compression deformity at L4. The stability of this finding is difficult to address based on axial images from prior PET CT, although some progression is suspected. There are no worrisome osseous findings.  IMPRESSION: 1. Numerous bilateral renal calculi. No evidence of ureteral calculus or hydronephrosis. 2. Grossly stable low density renal lesions bilaterally. 3. 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 4. Enlarging left inguinal hernia containing a knuckle of the sigmoid colon. No evidence of incarceration or bowel obstruction. 5. Superior endplate compression deformity at L4, likely progressive from available prior studies.   Electronically Signed   By: Camie Patience M.D.   On: 12/15/2013 19:58   Ct Angio Chest Pe W/cm  &/or Wo Cm  12/16/2013   CLINICAL DATA:  Lower back pain. Hypoxia. Evaluate for pulmonary embolus.  EXAM: CT ANGIOGRAPHY CHEST WITH CONTRAST  TECHNIQUE: Multidetector CT imaging of the chest was performed using the standard protocol during bolus administration of intravenous contrast. Multiplanar CT image reconstructions and MIPs were obtained to evaluate the vascular anatomy.  CONTRAST:  139mL OMNIPAQUE IOHEXOL 350 MG/ML SOLN  COMPARISON:  11/15/2013  FINDINGS: The heart size is normal. There is calcified atherosclerotic disease involving the thoracic aorta as well as the LAD and RCA coronary arteries. No pericardial effusion. No mediastinal or hilar adenopathy. No enlarged axillary or supraclavicular lymph nodes identified. The main pulmonary artery is normal. No lobar or segmental pulmonary artery filling defects to suggest acute pulmonary embolus.  The part solid nodule within the right upper lobe measures 1.1 x 1.3 cm, image 20/ series 6. This is unchanged from previous exam. Atelectasis is identified in both lung bases. Review of the visualized osseous structures is significant for osteopenia and multilevel spondylosis. No aggressive lytic or sclerotic bone lesions identified. No fractures identified. Next  Incidental imaging through the upper abdomen shows no acute findings.  Review of the MIP images confirms the above findings.  IMPRESSION: 1. No evidence for acute pulmonary embolus. 2. Atherosclerotic disease including coronary artery calcifications. 3. Persistent and unchanged semi-solid nodule within the right upper lobe. This remains concerning for primary bronchogenic carcinoma (adenocarcinoma favored). Pulmonary Medicine or thoracic surgery consultation should be considered.   Electronically Signed   By: Kerby Moors M.D.   On: 12/16/2013 01:22   Dg Chest Portable 1 View  12/15/2013   CLINICAL DATA:  CHEST PAIN  EXAM: PORTABLE CHEST - 1 VIEW  COMPARISON:  Two-view chest 05/08/2013  FINDINGS: Low  lung volumes. The heart size and mediastinal contours are within normal limits. Both lungs are clear. The visualized skeletal structures are unremarkable.  IMPRESSION: No active disease.   Electronically Signed   By: Margaree Mackintosh M.D.   On: 12/15/2013 15:46    CBC  Recent Labs Lab 12/15/13 1530 12/16/13 0409 12/17/13 0819 12/18/13 0604  WBC 10.6* 10.3 8.9 7.6  HGB 13.9 13.0 12.0* 12.3*  HCT 40.2 37.4* 34.6* 35.9*  PLT 242 213 209 241  MCV 98.0 97.9 97.7 97.6  MCH 33.9 34.0 33.9 33.4  MCHC 34.6 34.8 34.7 34.3  RDW 12.7 12.5 12.3 12.2  LYMPHSABS 1.6  --   --   --  MONOABS 1.6*  --   --   --   EOSABS 0.0  --   --   --   BASOSABS 0.0  --   --   --     Chemistries   Recent Labs Lab 12/15/13 1530 12/16/13 0409 12/17/13 0819 12/18/13 0604 12/19/13 0643  NA 134* 129* 134* 136* 138  K 4.1 3.9 3.9 3.8 3.7  CL 94* 92* 99 99 100  CO2 27 26 26 26 26   GLUCOSE 118* 119* 98 93 95  BUN 12 10 11  5* 4*  CREATININE 0.65 0.68 0.61 0.52 0.55  CALCIUM 9.5 9.1 9.0 9.2 9.4  AST  --  16  --   --   --   ALT  --  8  --   --   --   ALKPHOS  --  74  --   --   --   BILITOT  --  0.8  --   --   --    ------------------------------------------------------------------------------------------------------------------ estimated creatinine clearance is 77.3 ml/min (by C-G formula based on Cr of 0.55). ------------------------------------------------------------------------------------------------------------------ No results found for this basename: HGBA1C,  in the last 72 hours ------------------------------------------------------------------------------------------------------------------ No results found for this basename: CHOL, HDL, LDLCALC, TRIG, CHOLHDL, LDLDIRECT,  in the last 72 hours ------------------------------------------------------------------------------------------------------------------ No results found for this basename: TSH, T4TOTAL, FREET3, T3FREE, THYROIDAB,  in the last 72  hours ------------------------------------------------------------------------------------------------------------------ No results found for this basename: VITAMINB12, FOLATE, FERRITIN, TIBC, IRON, RETICCTPCT,  in the last 72 hours  Coagulation profile No results found for this basename: INR, PROTIME,  in the last 168 hours  No results found for this basename: DDIMER,  in the last 72 hours  Cardiac Enzymes  Recent Labs Lab 12/15/13 2207 12/16/13 0409 12/16/13 0956  TROPONINI <0.30 <0.30 <0.30   ------------------------------------------------------------------------------------------------------------------ No components found with this basename: POCBNP,     Lori Popowski D.O. on 12/19/2013 at 2:19 PM  Between 7am to 7pm - Pager - [phone]  After 7pm go to [email or web address] - password [email or web address]  And look for the night coverage person covering for me after hours  [facility] Office  [phone]

## 2013-12-19 NOTE — Progress Notes (Signed)
Patient ID: Jonathon Conway, male   DOB: [birth year], 80 y.o.   MRN: [id number]   MRI does confirm acute  L4  fx with possible L5 endplate fx with edema  Pt now scheduled for VP/KP 7/7 at [facility] Radiology Orders in computer to have pt Arrive Rad 930 am  Will return to [facility] after procedure  Has been off Xarelto x 7 days so far  Dr Ree Kida aware and agreeable

## 2013-12-19 NOTE — Progress Notes (Signed)
Patient ID: Jonathon Conway, male   DOB: 11-Jun-1932, 80 y.o.   MRN: [id number]   Request has been received for L4 kyphoplasty CT shows L4 fracture Intractable back pain  Dr Estanislado Pandy has reviewed films Would like to review MRI as soon as available (MRI is ordered per chart)  We will contact MD once Dr Estanislado Pandy has seen MRI imaging

## 2013-12-19 NOTE — Evaluation (Signed)
Physical Therapy Evaluation Patient Details Name: Jonathon Conway MRN: [id number] DOB: 1931-12-31 Today's Date: 12/19/2013   History of Present Illness  80 year old male who  has a past medical history of Essential hypertension, benign; Hypercholesteremia; Depression; Acid reflux; Gout; Prostate cancer; and Pulmonary emboli.  Today presents to the hospital with chief complaint of right lower back pain and spasm which started yesterday. Patient reports no injury or fall or trauma. Patient has a history of back problems and has been followed by [facility] orthopedics. Patient says that he has pinched nerve which caused radiation of the pain in the left buttock in the past, but today the pain is located in the right lower back and radiates up towards. The pain becomes worse on movement. Patient denies dysuria but does have urgency of urination half to the prostate surgery. Patient also has a recent history of pulmonary embolism and was started on Xarelto. He also complains of left-sided chest pain, denies nausea vomiting or diarrhea.  Patient has history of nodule in the right upper lung and is scheduled to go for surgery on 12/23/2013.  In the ED CT abdomen pelvis was done which shows compression at L4 superior endplate.    Clinical Impression  Pt is a 80 year old male who presents to physical therapy with dx of back pain.  Pt reports a chronic hx of LBP, though recently pain to the Rt side increased and spasms have begun.  Pt reports decreased pain today over the past couple days, and spasms are intermittent.  Noted CT reveals compression at L4 superior endplace; currently awaiting  consult when MRI results are returned.  Pt lives alone in a single story home, and reports he was previously mod (I) with use of std cane as needed.  During evaluation, the pt was mod (I) for bed mobility skills and transfers and was able to amb 250 feet while holding onto IV pole (pt did not want to hold onto cane today for amb  skills).  No LOB during evaluation or gait abnormalities noted.   Recommend pt to continue PT services in outpatient setting, to address chronic LBP with ROM, core stability, and back strengthening program.  Pt will be discharged from services in this setting.  No DME recommendations at this time.     Follow Up Recommendations Outpatient PT (Recommend OPPT to address core/trunk stabilization secondary to chronic hx of LBP and recent L4 involvement)    Equipment Recommendations  None recommended by PT       Precautions / Restrictions Precautions Precautions: Fall Precaution Comments: compression at L4 superior endplate, limiting bending, lifting, twisting Restrictions Weight Bearing Restrictions: No      Mobility  Bed Mobility Overal bed mobility: Modified Independent                Transfers Overall transfer level: Modified independent                  Ambulation/Gait Ambulation/Gait assistance: Min guard Ambulation Distance (Feet): 250 Feet Assistive device: 1 person hand held assist (On IV pole, as pt did not want to hold onto cane right now) Gait Pattern/deviations: WFL(Within Functional Limits)   Gait velocity interpretation: Below normal speed for age/gender        Balance Overall balance assessment: No apparent balance deficits (not formally assessed)  Pertinent Vitals/Pain Pt reports pain level 2-3/10 in Rt side low back.  Pt repositioned in bed at end of eval, to sitting at EOB for comfort.     Home Living Family/patient expects to be discharged to:: Private residence Living Arrangements: Alone Available Help at Discharge: Friend(s);Available PRN/intermittently Type of Home: House Home Access: Stairs to enter Entrance Stairs-Rails: Can reach both Entrance Stairs-Number of Steps: 3 Home Layout: One level Home Equipment: Cane - single point      Prior Function Level of Independence:  Independent with assistive device(s)         Comments: Uses cane as needed for amb skills.         Extremity/Trunk Assessment               Lower Extremity Assessment: Overall WFL for tasks assessed         Communication   Communication: HOH  Cognition Arousal/Alertness: Awake/alert Behavior During Therapy: WFL for tasks assessed/performed Overall Cognitive Status: Within Functional Limits for tasks assessed                               Assessment/Plan    PT Assessment All further PT needs can be met in the next venue of care  PT Diagnosis     PT Problem List Decreased strength;Pain  PT Treatment Interventions     PT Goals (Current goals can be found in the Care Plan section) Acute Rehab PT Goals PT Goal Formulation: No goals set, d/c therapy     End of Session Equipment Utilized During Treatment: Gait belt Activity Tolerance: Patient tolerated treatment well Patient left: in bed;with call bell/phone within reach;with bed alarm set Nurse Communication: Other (comment) (Ear started to bleed after pt washed his face at sink)         Time: 6962-9528 PT Time Calculation (min): 18 min   Charges:   PT Evaluation $Initial PT Evaluation Tier I: 1 Procedure          Oree Mirelez 12/19/2013, 10:41 AM

## 2013-12-19 NOTE — Discharge Summary (Signed)
Physician Discharge Summary  RUBERT FREDIANI CLE:[id number] DOB: Apr 30, 1932 DOA: 12/15/2013  PCP: Sherrie Mustache, MD  Admit date: 12/15/2013 Discharge date: 12/20/2013  Time spent: 45 minutes  Recommendations for Outpatient Follow-up:  Patient will be discharged to home.  He is to follow up with his primary care physician within one week of discharge.  He should follow with [facility] as needed.  Patient should continue to hold his Xarelto due to his upcoming procedure.  He should continue his medications as prescribed.  He should continue a heart healthy diet.  Patient was instructed to not drive and to use a walker.  Discharge Diagnoses:  Intractable back pain next mind chest pain History of pulmonary embolism Acute episode of hypoxia Altered mental status Hypertension History of lung nodule Hyponatremia/hypochloremia Depression  Discharge Condition: Stable  Diet recommendation: heart healthy  Filed Weights   12/15/13 1506  Weight: 74.39 kg (164 lb)    History of present illness:  80 year old male with history of hypertension, hyperlipidemia, depression, prostate cancer and pulmonary emboli on Zoloft who presents emergency department with complaints of right lower back pain and spasm which started yesterday. Patient reports no injury or fall or trauma. He does have a history of back problems and has been followed by [facility] orthopedics. Patient states he's had a pinched nerve which caused radiation of the pain in the left buttock in the past but currently the pain is located in the right lower back and radiates upwards. The pain becomes worse with movement. Patient denied dysuria but does have urgency of urination due to his prostate surgery. He does have recent history of pulmonary embolism and was started on Xarelto. Patient also complained of left-sided chest pain denies any nausea, vomiting, diarrhea. Patient also has a history of a lung nodule in the right  upper lung and is scheduled for surgery on 12/23/2013. In the emergency department, CT of the abdomen and pelvis was done which shows a compression at the L4 superior endplate. Patient did receive Valium 5 mg along with morphine 4 mg and after that he had a brief episode of hypoxia with O2 saturations dropping 85%.  Hospital Course:  Intractable back pain  -Patient has had a long-standing history of back pain however states this is the worse he's had.  -CT of the abdomen and pelvis shows inferior endplate compression deformity at L4, likely progressive from the available prior studies  -MRI:  acute fractures, several mild broad-disc bulging -Patient underwent L4 kyphoplasty with IR at [facility] -Continue pain management with robaxin -PT consulted and recommended outpatient PT  Chest pain  -Troponins remained negative  -Patient currently chest pain-free   History of pulmonary embolism  -On anticoagulation however this will need to be held due to his upcoming surgery  -CT angiogram showed no acute pulmonary embolism   Acute episode of hypoxia  -Resolved, Likely secondary to medication including Valium as well as morphine   Altered mental status  -Resolved, Likely secondary to pain medication  -UA negative for infection, chest x-ray showed no consolidation   Hypertension  -Currently controlled, continue amlodipine   History of lung nodule  -Patient scheduled for surgery on 12/23/2013 at [place] will need to be held for 7 days prior to surgery   Hyponatremia/hypochloremia  -Resolved  Depression  -Continue lexapro  Procedures: L4 kyphoplasty  Consultations: Dr. Alvan Dame, via phone IR   Discharge Exam: Filed Vitals:   12/20/13 0654  BP: 141/69  Pulse: 64  Temp:  97.5 F (36.4 C)  Resp: 20   Exam  General: Well developed, well nourished, NAD, appears stated age  HEENT: NCAT, mucous membranes moist.  Neck: Supple, no JVD, no masses  Cardiovascular: S1  S2 auscultated, no rubs, murmurs or gallops. Regular rate and rhythm.  Respiratory: Clear to auscultation bilaterally with equal chest rise  Abdomen: Soft, nontender, nondistended, + bowel sounds  Extremities: warm dry without cyanosis clubbing or edema  Neuro: AAOx3, No focal deficits  Skin: Without rashes exudates or nodules  Psych: Appropriate mood and affect, slightly anxious  Discharge Instructions      Discharge Instructions   Discharge instructions    Complete by:  As directed   Patient will be discharged to home.  He is to follow up with his primary care physician within one week of discharge.  He should follow with [facility] as needed.  Patient should continue to hold his Xarelto due to his upcoming procedure.  He should continue his medications as prescribed.  He should continue a heart healthy diet.  Patient was instructed not to drive and to use a walker.            Medication List    STOP taking these medications       XARELTO 20 MG Tabs tablet  Generic drug:  rivaroxaban      TAKE these medications       allopurinol 100 MG tablet  Commonly known as:  ZYLOPRIM  Take 100 mg by mouth every morning.     amLODipine 5 MG tablet  Commonly known as:  NORVASC  Take 5 mg by mouth every morning.     B-12 PO  Take 1 tablet by mouth daily.     cholecalciferol 1000 UNITS tablet  Commonly known as:  VITAMIN D  Take 1,000 Units by mouth every morning.     escitalopram 10 MG tablet  Commonly known as:  LEXAPRO  Take 10 mg by mouth every morning.     esomeprazole 40 MG capsule  Commonly known as:  NEXIUM  Take 40 mg by mouth every morning.     HYDROcodone-acetaminophen 5-325 MG per tablet  Commonly known as:  NORCO/VICODIN  Take 1 tablet by mouth every 6 (six) hours as needed for pain.     LORazepam 0.5 MG tablet  Commonly known as:  ATIVAN  Take 0.5 mg by mouth daily as needed for anxiety.     methocarbamol 500 MG tablet  Commonly known as:   ROBAXIN  Take 1 tablet (500 mg total) by mouth every 8 (eight) hours as needed for muscle spasms.     multivitamin tablet  Take 1 tablet by mouth daily.     nitroGLYCERIN 0.4 MG SL tablet  Commonly known as:  NITROSTAT  Place 1 tablet (0.4 mg total) under the tongue every 5 (five) minutes as needed for chest pain. Up to 3 doses. If no relief after 3rd dose, proceed to ED for evaluation.     pravastatin 40 MG tablet  Commonly known as:  PRAVACHOL  Take 40 mg by mouth every morning.     pyridOXINE 100 MG tablet  Commonly known as:  VITAMIN B-6  Take 100 mg by mouth every morning.     vitamin E 400 UNIT capsule  Take 400 Units by mouth daily.       Allergies  Allergen Reactions  . Asa [Aspirin] Anaphylaxis   Follow-up Information   Follow up with [facility]. (  they will call you)    Contact information:   [address] [place] [address] [phone]       Follow up with Sherrie Mustache, MD. Schedule an appointment as soon as possible for a visit in 1 week. [facility] followup)    Specialty:  Family Medicine   Contact information:   [place] [address] [phone]        The results of significant diagnostics from this hospitalization (including imaging, microbiology, ancillary and laboratory) are listed below for reference.    Significant Diagnostic Studies: Ct Abdomen Pelvis Wo Contrast  12/15/2013   CLINICAL DATA:  Low back spasms and hematuria. History of pulmonary nodule and prostate cancer.  EXAM: CT ABDOMEN AND PELVIS WITHOUT CONTRAST  TECHNIQUE: Multidetector CT imaging of the abdomen and pelvis was performed following the standard protocol without IV contrast.  COMPARISON:  PET CTs dated 04/25/2013 and 02/11/2013. Chest CT 11/15/2013.  FINDINGS: There is new subsegmental atelectasis at both lung bases. No confluent airspace opacity or significant pleural effusion is present. There is diffuse atherosclerosis of the  aorta and coronary arteries.  Again demonstrated are numerous bilateral renal calculi. There is a 14 mm right upper pole caliceal calculus. There is no hydronephrosis or ureteral calculus. Low-density renal lesions bilaterally are grossly stable.  The liver, gallbladder, spleen, adrenal glands and pancreas appear stable without significant findings.  There is extensive atherosclerosis of the aorta, its branches and the iliac arteries. 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  The stomach, small bowel and colon demonstrate no significant findings. A left inguinal hernia has enlarged and now contains a knuckle of the sigmoid colon. There is no evidence of bowel incarceration or obstruction. Postsurgical changes are present in the right groin. Prostate brachytherapy seeds are noted. There is no pelvic lymphadenopathy. The bladder appears normal.  There is a prominent Schmorl's node and superior endplate compression deformity at L4. The stability of this finding is difficult to address based on axial images from prior PET CT, although some progression is suspected. There are no worrisome osseous findings.  IMPRESSION: 1. Numerous bilateral renal calculi. No evidence of ureteral calculus or hydronephrosis. 2. Grossly stable low density renal lesions bilaterally. 3. 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 4. Enlarging left inguinal hernia containing a knuckle of the sigmoid colon. No evidence of incarceration or bowel obstruction. 5. Superior endplate compression deformity at L4, likely progressive from available prior studies.   Electronically Signed   By: Camie Patience M.D.   On: 12/15/2013 19:58   Ct Angio Chest Pe W/cm &/or Wo Cm  12/16/2013   CLINICAL DATA:  Lower back pain. Hypoxia. Evaluate for pulmonary embolus.  EXAM: CT ANGIOGRAPHY CHEST WITH CONTRAST  TECHNIQUE: Multidetector CT imaging of the chest was performed using the  standard protocol during bolus administration of intravenous contrast. Multiplanar CT image reconstructions and MIPs were obtained to evaluate the vascular anatomy.  CONTRAST:  117mL OMNIPAQUE IOHEXOL 350 MG/ML SOLN  COMPARISON:  11/15/2013  FINDINGS: The heart size is normal. There is calcified atherosclerotic disease involving the thoracic aorta as well as the LAD and RCA coronary arteries. No pericardial effusion. No mediastinal or hilar adenopathy. No enlarged axillary or supraclavicular lymph nodes identified. The main pulmonary artery is normal. No lobar or segmental pulmonary artery filling defects to suggest acute pulmonary embolus.  The part solid nodule within the right upper lobe measures 1.1 x 1.3  cm, image 20/ series 6. This is unchanged from previous exam. Atelectasis is identified in both lung bases. Review of the visualized osseous structures is significant for osteopenia and multilevel spondylosis. No aggressive lytic or sclerotic bone lesions identified. No fractures identified. Next  Incidental imaging through the upper abdomen shows no acute findings.  Review of the MIP images confirms the above findings.  IMPRESSION: 1. No evidence for acute pulmonary embolus. 2. Atherosclerotic disease including coronary artery calcifications. 3. Persistent and unchanged semi-solid nodule within the right upper lobe. This remains concerning for primary bronchogenic carcinoma (adenocarcinoma favored). Pulmonary Medicine or thoracic surgery consultation should be considered.   Electronically Signed   By: Kerby Moors M.D.   On: 12/16/2013 01:22   Mr Lumbar Spine W Wo Contrast  12/19/2013   CLINICAL DATA:  Low back pain  EXAM: MRI LUMBAR SPINE WITHOUT AND WITH CONTRAST  TECHNIQUE: Multiplanar and multiecho pulse sequences of the lumbar spine were obtained without and with intravenous contrast.  CONTRAST:  31mL MULTIHANCE GADOBENATE DIMEGLUMINE 529 MG/ML IV SOLN  COMPARISON:  None.  FINDINGS: There is an acute  compression fracture of the L4 vertebral body with marrow edema diffusely throughout the vertebral body and approximately 50% height loss. Marrow edema extends into the pedicles.  The remainder the vertebral body heights are maintained. The alignment is anatomic. There is normal bone marrow signal demonstrated throughout the vertebra. There is degenerative disc disease at L4-5 and L5-S1. The L4 vertebral body enhances which is likely related to the fracture. There are no other areas of abnormal enhancement.  The spinal cord is normal in signal and contour. The cord terminates normally at L1 . The nerve roots of the cauda equina and the filum terminale are normal.  The visualized portions of the SI joints are unremarkable.  There are bilateral T2 hyperintense, nonenhancing renal mass is most consistent with cysts.  T12-L1: No significant disc bulge. No evidence of neural foraminal stenosis. No central canal stenosis.  L1-L2: No significant disc bulge. No evidence of neural foraminal stenosis. No central canal stenosis.  L2-L3: Mild broad-based disc bulge with a left foraminal/ far lateral disc component. No evidence of neural foraminal stenosis. No central canal stenosis.  L3-L4: Mild broad-based disc bulge flattening the ventral thecal sac. There is a left far lateral disc component in close proximity to the left extra foraminal L3 nerve root. Bilateral lateral recess stenosis. No evidence of neural foraminal stenosis. No central canal stenosis.  L4-L5: Mild broad-based disc bulge with a right far lateral disc osteophyte complex. Moderate bilateral facet arthropathy with a small left extraspinal synovial cyst. No evidence of neural foraminal stenosis. No central canal stenosis.  L5-S1: No significant disc bulge. No evidence of neural foraminal stenosis. No central canal stenosis.  IMPRESSION: 1. At L2-3 there is a mild broad-based disc bulge with a left foraminal/far lateral disc component. 2. At L3-4 there is a  mild broad-based disc bulge flattening the ventral thecal sac. There is a left far lateral disc component in close proximity to the left extra foraminal L3 nerve root. Bilateral lateral recess stenosis. 3. At L4-5 there is a mild broad-based disc bulge with a right far lateral disc osteophyte complex. Moderate bilateral facet arthropathy with a small left extra-spinal synovial cyst.   Electronically Signed   By: Kathreen Devoid   On: 12/19/2013 13:44   Dg Chest Portable 1 View  12/15/2013   CLINICAL DATA:  CHEST PAIN  EXAM: PORTABLE CHEST - 1 VIEW  COMPARISON:  Two-view chest 05/08/2013  FINDINGS: Low lung volumes. The heart size and mediastinal contours are within normal limits. Both lungs are clear. The visualized skeletal structures are unremarkable.  IMPRESSION: No active disease.   Electronically Signed   By: Margaree Mackintosh M.D.   On: 12/15/2013 15:46    Microbiology: No results found for this or any previous visit (from the past 240 hour(s)).   Labs: Basic Metabolic Panel:  Recent Labs Lab 12/16/13 0409 12/17/13 0819 12/18/13 0604 12/19/13 0643 12/20/13 0620  NA 129* 134* 136* 138 138  K 3.9 3.9 3.8 3.7 3.6*  CL 92* 99 99 100 99  CO2 26 26 26 26 26   GLUCOSE 119* 98 93 95 94  BUN 10 11 5* 4* 4*  CREATININE 0.68 0.61 0.52 0.55 0.51  CALCIUM 9.1 9.0 9.2 9.4 9.8   Liver Function Tests:  Recent Labs Lab 12/16/13 0409  AST 16  ALT 8  ALKPHOS 74  BILITOT 0.8  PROT 6.3  ALBUMIN 3.2*   No results found for this basename: LIPASE, AMYLASE,  in the last 168 hours No results found for this basename: AMMONIA,  in the last 168 hours CBC:  Recent Labs Lab 12/15/13 1530 12/16/13 0409 12/17/13 0819 12/18/13 0604 12/20/13 0620  WBC 10.6* 10.3 8.9 7.6 5.3  NEUTROABS 7.3  --   --   --   --   HGB 13.9 13.0 12.0* 12.3* 12.5*  HCT 40.2 37.4* 34.6* 35.9* 35.9*  MCV 98.0 97.9 97.7 97.6 95.7  PLT 242 213 209 241 294   Cardiac Enzymes:  Recent Labs Lab 12/15/13 1530 12/15/13 2207  12/16/13 0409 12/16/13 0956  TROPONINI <0.30 <0.30 <0.30 <0.30   BNP: BNP (last 3 results)  Recent Labs  02/04/13 1238  PROBNP 153.1   CBG: No results found for this basename: GLUCAP,  in the last 168 hours     Signed:  Cristal Ford  [facility] 12/20/2013, 5:42 PM

## 2013-12-20 ENCOUNTER — Ambulatory Visit (HOSPITAL_COMMUNITY)
Admit: 2013-12-20 | Discharge: 2013-12-20 | Disposition: A | Discharge status: Home / Self Care | Payer: Medicare Other | Attending: Internal Medicine | Admitting: Internal Medicine

## 2013-12-20 ENCOUNTER — Encounter (HOSPITAL_COMMUNITY): Payer: Self-pay

## 2013-12-20 LAB — CBC
HEMATOCRIT: 35.9 % — AB (ref 39.0–52.0)
Hemoglobin: 12.5 g/dL — ABNORMAL LOW (ref 13.0–17.0)
MCH: 33.3 pg (ref 26.0–34.0)
MCHC: 34.8 g/dL (ref 30.0–36.0)
MCV: 95.7 fL (ref 78.0–100.0)
Platelets: 294 10*3/uL (ref 150–400)
RBC: 3.75 MIL/uL — AB (ref 4.22–5.81)
RDW: 12.2 % (ref 11.5–15.5)
WBC: 5.3 10*3/uL (ref 4.0–10.5)

## 2013-12-20 LAB — BASIC METABOLIC PANEL
Anion gap: 13 (ref 5–15)
BUN: 4 mg/dL — AB (ref 6–23)
CHLORIDE: 99 meq/L (ref 96–112)
CO2: 26 mEq/L (ref 19–32)
CREATININE: 0.51 mg/dL (ref 0.50–1.35)
Calcium: 9.8 mg/dL (ref 8.4–10.5)
GFR calc Af Amer: >90 mL/min (ref >90)
GFR calc non Af Amer: >90 mL/min (ref >90)
GLUCOSE: 94 mg/dL (ref 70–99)
POTASSIUM: 3.6 meq/L — AB (ref 3.7–5.3)
Sodium: 138 mEq/L (ref 137–147)

## 2013-12-20 MED ORDER — METHOCARBAMOL 500 MG PO TABS: 500.0000 mg | ORAL_TABLET | Freq: Three times a day (TID) | ORAL | Status: DC | PRN

## 2013-12-20 MED ORDER — FENTANYL CITRATE 0.05 MG/ML IJ SOLN
INTRAMUSCULAR | Status: AC
  Filled 2013-12-20: qty 4

## 2013-12-20 MED ORDER — MIDAZOLAM HCL 2 MG/2ML IJ SOLN
INTRAMUSCULAR | Status: AC | PRN
  Administered 2013-12-20 (×3): 1 mg via INTRAVENOUS

## 2013-12-20 MED ORDER — TOBRAMYCIN SULFATE 1.2 G IJ SOLR
INTRAMUSCULAR | Status: AC
  Filled 2013-12-20: qty 1.2

## 2013-12-20 MED ORDER — SODIUM CHLORIDE 0.9 % IV SOLN: INTRAVENOUS | Status: DC

## 2013-12-20 MED ORDER — MIDAZOLAM HCL 2 MG/2ML IJ SOLN
INTRAMUSCULAR | Status: AC
  Filled 2013-12-20: qty 4

## 2013-12-20 MED ORDER — FENTANYL CITRATE 0.05 MG/ML IJ SOLN
INTRAMUSCULAR | Status: AC | PRN
  Administered 2013-12-20 (×3): 25 ug via INTRAVENOUS

## 2013-12-20 NOTE — Progress Notes (Signed)
Patient being d/c home with prescriptions and instructions. IV cath removed and intact. Verbalizes understanding of post care and continuation of care. No pain/swelling at site. No c/o pain at this time. Family at bedside.

## 2013-12-20 NOTE — Discharge Instructions (Signed)
1.No stooping,bending,or lifting more than 10 lbs for 2 weeks. 2.Use walker to ambulate  for  2 weeks. 3.RTC in 2 weeks

## 2013-12-20 NOTE — Discharge Instructions (Signed)

## 2013-12-20 NOTE — Procedures (Signed)
S/P L4 KP 

## 2013-12-20 NOTE — Progress Notes (Signed)
Patient ID: Jonathon Conway, male   DOB: 09/29/1931, 80 y.o.   MRN: [id number] Request received for L4/possibly L5 vertebroplasty/kyphoplasty on pt with history of persistent and worsening low back pain and evidence of acute compression fractures on MRI at L4 and possibly L5 levels. Imaging studies have been reviewed by Dr. Estanislado Pandy. Additional PMH as below. Exam: pt awake/alert; chest- CTA bilat; heart-RRR; abd- soft,+BS,NT; ext- FROM, no edema. Mild to mod paravertebral tenderness low back region.   Filed Vitals:   12/20/13 0933  BP: 158/71  Pulse: 62  Temp: 97.7 F (36.5 C)  TempSrc: Oral  Resp: 15  SpO2: 95%   Past Medical History  Diagnosis Date  . Essential hypertension, benign   . Hypercholesteremia   . Depression   . Acid reflux   . Gout   . Prostate cancer   . Pulmonary emboli    Past Surgical History  Procedure Laterality Date  . Fracture surgery    . Hemorrhoid surgery    . Hernia repair    . Prostate surgery      prostate seed implantation  . Left elbow surgery      tendon repair?  . Tonsillectomy     Ct Abdomen Pelvis Wo Contrast  12/15/2013   CLINICAL DATA:  Low back spasms and hematuria. History of pulmonary nodule and prostate cancer.  EXAM: CT ABDOMEN AND PELVIS WITHOUT CONTRAST  TECHNIQUE: Multidetector CT imaging of the abdomen and pelvis was performed following the standard protocol without IV contrast.  COMPARISON:  PET CTs dated 04/25/2013 and 02/11/2013. Chest CT 11/15/2013.  FINDINGS: There is new subsegmental atelectasis at both lung bases. No confluent airspace opacity or significant pleural effusion is present. There is diffuse atherosclerosis of the aorta and coronary arteries.  Again demonstrated are numerous bilateral renal calculi. There is a 14 mm right upper pole caliceal calculus. There is no hydronephrosis or ureteral calculus. Low-density renal lesions bilaterally are grossly stable.  The liver, gallbladder, spleen, adrenal glands and pancreas  appear stable without significant findings.  There is extensive atherosclerosis of the aorta, its branches and the iliac arteries. 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  The stomach, small bowel and colon demonstrate no significant findings. A left inguinal hernia has enlarged and now contains a knuckle of the sigmoid colon. There is no evidence of bowel incarceration or obstruction. Postsurgical changes are present in the right groin. Prostate brachytherapy seeds are noted. There is no pelvic lymphadenopathy. The bladder appears normal.  There is a prominent Schmorl's node and superior endplate compression deformity at L4. The stability of this finding is difficult to address based on axial images from prior PET CT, although some progression is suspected. There are no worrisome osseous findings.  IMPRESSION: 1. Numerous bilateral renal calculi. No evidence of ureteral calculus or hydronephrosis. 2. Grossly stable low density renal lesions bilaterally. 3. 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 4. Enlarging left inguinal hernia containing a knuckle of the sigmoid colon. No evidence of incarceration or bowel obstruction. 5. Superior endplate compression deformity at L4, likely progressive from available prior studies.   Electronically Signed   By: Camie Patience M.D.   On: 12/15/2013 19:58   Ct Angio Chest Pe W/cm &/or Wo Cm  12/16/2013   CLINICAL DATA:  Lower back pain. Hypoxia. Evaluate for pulmonary embolus.  EXAM: CT ANGIOGRAPHY CHEST WITH CONTRAST  TECHNIQUE: Multidetector CT imaging of the chest was performed using  the standard protocol during bolus administration of intravenous contrast. Multiplanar CT image reconstructions and MIPs were obtained to evaluate the vascular anatomy.  CONTRAST:  174mL OMNIPAQUE IOHEXOL 350 MG/ML SOLN  COMPARISON:  11/15/2013  FINDINGS: The heart size is normal. There is calcified  atherosclerotic disease involving the thoracic aorta as well as the LAD and RCA coronary arteries. No pericardial effusion. No mediastinal or hilar adenopathy. No enlarged axillary or supraclavicular lymph nodes identified. The main pulmonary artery is normal. No lobar or segmental pulmonary artery filling defects to suggest acute pulmonary embolus.  The part solid nodule within the right upper lobe measures 1.1 x 1.3 cm, image 20/ series 6. This is unchanged from previous exam. Atelectasis is identified in both lung bases. Review of the visualized osseous structures is significant for osteopenia and multilevel spondylosis. No aggressive lytic or sclerotic bone lesions identified. No fractures identified. Next  Incidental imaging through the upper abdomen shows no acute findings.  Review of the MIP images confirms the above findings.  IMPRESSION: 1. No evidence for acute pulmonary embolus. 2. Atherosclerotic disease including coronary artery calcifications. 3. Persistent and unchanged semi-solid nodule within the right upper lobe. This remains concerning for primary bronchogenic carcinoma (adenocarcinoma favored). Pulmonary Medicine or thoracic surgery consultation should be considered.   Electronically Signed   By: Kerby Moors M.D.   On: 12/16/2013 01:22   Mr Lumbar Spine W Wo Contrast  12/19/2013   CLINICAL DATA:  Low back pain  EXAM: MRI LUMBAR SPINE WITHOUT AND WITH CONTRAST  TECHNIQUE: Multiplanar and multiecho pulse sequences of the lumbar spine were obtained without and with intravenous contrast.  CONTRAST:  28mL MULTIHANCE GADOBENATE DIMEGLUMINE 529 MG/ML IV SOLN  COMPARISON:  None.  FINDINGS: There is an acute compression fracture of the L4 vertebral body with marrow edema diffusely throughout the vertebral body and approximately 50% height loss. Marrow edema extends into the pedicles.  The remainder the vertebral body heights are maintained. The alignment is anatomic. There is normal bone marrow  signal demonstrated throughout the vertebra. There is degenerative disc disease at L4-5 and L5-S1. The L4 vertebral body enhances which is likely related to the fracture. There are no other areas of abnormal enhancement.  The spinal cord is normal in signal and contour. The cord terminates normally at L1 . The nerve roots of the cauda equina and the filum terminale are normal.  The visualized portions of the SI joints are unremarkable.  There are bilateral T2 hyperintense, nonenhancing renal mass is most consistent with cysts.  T12-L1: No significant disc bulge. No evidence of neural foraminal stenosis. No central canal stenosis.  L1-L2: No significant disc bulge. No evidence of neural foraminal stenosis. No central canal stenosis.  L2-L3: Mild broad-based disc bulge with a left foraminal/ far lateral disc component. No evidence of neural foraminal stenosis. No central canal stenosis.  L3-L4: Mild broad-based disc bulge flattening the ventral thecal sac. There is a left far lateral disc component in close proximity to the left extra foraminal L3 nerve root. Bilateral lateral recess stenosis. No evidence of neural foraminal stenosis. No central canal stenosis.  L4-L5: Mild broad-based disc bulge with a right far lateral disc osteophyte complex. Moderate bilateral facet arthropathy with a small left extraspinal synovial cyst. No evidence of neural foraminal stenosis. No central canal stenosis.  L5-S1: No significant disc bulge. No evidence of neural foraminal stenosis. No central canal stenosis.  IMPRESSION: 1. At L2-3 there is a mild broad-based disc bulge with a left  foraminal/far lateral disc component. 2. At L3-4 there is a mild broad-based disc bulge flattening the ventral thecal sac. There is a left far lateral disc component in close proximity to the left extra foraminal L3 nerve root. Bilateral lateral recess stenosis. 3. At L4-5 there is a mild broad-based disc bulge with a right far lateral disc osteophyte  complex. Moderate bilateral facet arthropathy with a small left extra-spinal synovial cyst.   Electronically Signed   By: Kathreen Devoid   On: 12/19/2013 13:44   Dg Chest Portable 1 View  12/15/2013   CLINICAL DATA:  CHEST PAIN  EXAM: PORTABLE CHEST - 1 VIEW  COMPARISON:  Two-view chest 05/08/2013  FINDINGS: Low lung volumes. The heart size and mediastinal contours are within normal limits. Both lungs are clear. The visualized skeletal structures are unremarkable.  IMPRESSION: No active disease.   Electronically Signed   By: Margaree Mackintosh M.D.   On: 12/15/2013 15:46  Results for orders placed during the hospital encounter of 12/15/13  CBC WITH DIFFERENTIAL      Result Value Ref Range   WBC 10.6 (*) 4.0 - 10.5 K/uL   RBC 4.10 (*) 4.22 - 5.81 MIL/uL   Hemoglobin 13.9  13.0 - 17.0 g/dL   HCT 40.2  39.0 - 52.0 %   MCV 98.0  78.0 - 100.0 fL   MCH 33.9  26.0 - 34.0 pg   MCHC 34.6  30.0 - 36.0 g/dL   RDW 12.7  11.5 - 15.5 %   Platelets 242  150 - 400 K/uL   Neutrophils Relative % 70  43 - 77 %   Neutro Abs 7.3  1.7 - 7.7 K/uL   Lymphocytes Relative 15  12 - 46 %   Lymphs Abs 1.6  0.7 - 4.0 K/uL   Monocytes Relative 15 (*) 3 - 12 %   Monocytes Absolute 1.6 (*) 0.1 - 1.0 K/uL   Eosinophils Relative 0  0 - 5 %   Eosinophils Absolute 0.0  0.0 - 0.7 K/uL   Basophils Relative 0  0 - 1 %   Basophils Absolute 0.0  0.0 - 0.1 K/uL  BASIC METABOLIC PANEL      Result Value Ref Range   Sodium 134 (*) 137 - 147 mEq/L   Potassium 4.1  3.7 - 5.3 mEq/L   Chloride 94 (*) 96 - 112 mEq/L   CO2 27  19 - 32 mEq/L   Glucose, Bld 118 (*) 70 - 99 mg/dL   BUN 12  6 - 23 mg/dL   Creatinine, Ser 0.65  0.50 - 1.35 mg/dL   Calcium 9.5  8.4 - 10.5 mg/dL   GFR calc non Af Amer >90  >90 mL/min   GFR calc Af Amer >90  >90 mL/min   Anion gap 13  5 - 15  TROPONIN I      Result Value Ref Range   Troponin I <0.30  <0.30 ng/mL  URINALYSIS, ROUTINE W REFLEX MICROSCOPIC      Result Value Ref Range   Color, Urine YELLOW   YELLOW   APPearance CLEAR  CLEAR   Specific Gravity, Urine 1.020  1.005 - 1.030   pH 5.5  5.0 - 8.0   Glucose, UA NEGATIVE  NEGATIVE mg/dL   Hgb urine dipstick LARGE (*) NEGATIVE   Bilirubin Urine NEGATIVE  NEGATIVE   Ketones, ur NEGATIVE  NEGATIVE mg/dL   Protein, ur TRACE (*) NEGATIVE mg/dL   Urobilinogen, UA 0.2  0.0 - 1.0  mg/dL   Nitrite NEGATIVE  NEGATIVE   Leukocytes, UA NEGATIVE  NEGATIVE  URINE MICROSCOPIC-ADD ON      Result Value Ref Range   Squamous Epithelial / LPF RARE  RARE   WBC, UA 3-6  <3 WBC/hpf   RBC / HPF TOO NUMEROUS TO COUNT  <3 RBC/hpf   Bacteria, UA FEW (*) RARE  TROPONIN I      Result Value Ref Range   Troponin I <0.30  <0.30 ng/mL  TROPONIN I      Result Value Ref Range   Troponin I <0.30  <0.30 ng/mL  TROPONIN I      Result Value Ref Range   Troponin I <0.30  <0.30 ng/mL  CBC      Result Value Ref Range   WBC 10.3  4.0 - 10.5 K/uL   RBC 3.82 (*) 4.22 - 5.81 MIL/uL   Hemoglobin 13.0  13.0 - 17.0 g/dL   HCT 37.4 (*) 39.0 - 52.0 %   MCV 97.9  78.0 - 100.0 fL   MCH 34.0  26.0 - 34.0 pg   MCHC 34.8  30.0 - 36.0 g/dL   RDW 12.5  11.5 - 15.5 %   Platelets 213  150 - 400 K/uL  COMPREHENSIVE METABOLIC PANEL      Result Value Ref Range   Sodium 129 (*) 137 - 147 mEq/L   Potassium 3.9  3.7 - 5.3 mEq/L   Chloride 92 (*) 96 - 112 mEq/L   CO2 26  19 - 32 mEq/L   Glucose, Bld 119 (*) 70 - 99 mg/dL   BUN 10  6 - 23 mg/dL   Creatinine, Ser 0.68  0.50 - 1.35 mg/dL   Calcium 9.1  8.4 - 10.5 mg/dL   Total Protein 6.3  6.0 - 8.3 g/dL   Albumin 3.2 (*) 3.5 - 5.2 g/dL   AST 16  0 - 37 U/L   ALT 8  0 - 53 U/L   Alkaline Phosphatase 74  39 - 117 U/L   Total Bilirubin 0.8  0.3 - 1.2 mg/dL   GFR calc non Af Amer 88 (*) >90 mL/min   GFR calc Af Amer >90  >90 mL/min   Anion gap 11  5 - 15  BASIC METABOLIC PANEL      Result Value Ref Range   Sodium 134 (*) 137 - 147 mEq/L   Potassium 3.9  3.7 - 5.3 mEq/L   Chloride 99  96 - 112 mEq/L   CO2 26  19 - 32 mEq/L    Glucose, Bld 98  70 - 99 mg/dL   BUN 11  6 - 23 mg/dL   Creatinine, Ser 0.61  0.50 - 1.35 mg/dL   Calcium 9.0  8.4 - 10.5 mg/dL   GFR calc non Af Amer >90  >90 mL/min   GFR calc Af Amer >90  >90 mL/min   Anion gap 9  5 - 15  CBC      Result Value Ref Range   WBC 8.9  4.0 - 10.5 K/uL   RBC 3.54 (*) 4.22 - 5.81 MIL/uL   Hemoglobin 12.0 (*) 13.0 - 17.0 g/dL   HCT 34.6 (*) 39.0 - 52.0 %   MCV 97.7  78.0 - 100.0 fL   MCH 33.9  26.0 - 34.0 pg   MCHC 34.7  30.0 - 36.0 g/dL   RDW 12.3  11.5 - 15.5 %   Platelets 209  150 - 400 K/uL  CBC      Result Value Ref Range   WBC 7.6  4.0 - 10.5 K/uL   RBC 3.68 (*) 4.22 - 5.81 MIL/uL   Hemoglobin 12.3 (*) 13.0 - 17.0 g/dL   HCT 35.9 (*) 39.0 - 52.0 %   MCV 97.6  78.0 - 100.0 fL   MCH 33.4  26.0 - 34.0 pg   MCHC 34.3  30.0 - 36.0 g/dL   RDW 12.2  11.5 - 15.5 %   Platelets 241  150 - 400 K/uL  BASIC METABOLIC PANEL      Result Value Ref Range   Sodium 136 (*) 137 - 147 mEq/L   Potassium 3.8  3.7 - 5.3 mEq/L   Chloride 99  96 - 112 mEq/L   CO2 26  19 - 32 mEq/L   Glucose, Bld 93  70 - 99 mg/dL   BUN 5 (*) 6 - 23 mg/dL   Creatinine, Ser 0.52  0.50 - 1.35 mg/dL   Calcium 9.2  8.4 - 10.5 mg/dL   GFR calc non Af Amer >90  >90 mL/min   GFR calc Af Amer >90  >90 mL/min   Anion gap 11  5 - 15  BASIC METABOLIC PANEL      Result Value Ref Range   Sodium 138  137 - 147 mEq/L   Potassium 3.7  3.7 - 5.3 mEq/L   Chloride 100  96 - 112 mEq/L   CO2 26  19 - 32 mEq/L   Glucose, Bld 95  70 - 99 mg/dL   BUN 4 (*) 6 - 23 mg/dL   Creatinine, Ser 0.55  0.50 - 1.35 mg/dL   Calcium 9.4  8.4 - 10.5 mg/dL   GFR calc non Af Amer >90  >90 mL/min   GFR calc Af Amer >90  >90 mL/min   Anion gap 12  5 - 15  PROTIME-INR      Result Value Ref Range   Prothrombin Time 15.5 (*) 11.6 - 15.2 seconds   INR 1.23  0.00 - 1.49  APTT      Result Value Ref Range   aPTT 43 (*) 24 - 37 seconds  CBC      Result Value Ref Range   WBC 5.3  4.0 - 10.5 K/uL   RBC 3.75  (*) 4.22 - 5.81 MIL/uL   Hemoglobin 12.5 (*) 13.0 - 17.0 g/dL   HCT 35.9 (*) 39.0 - 52.0 %   MCV 95.7  78.0 - 100.0 fL   MCH 33.3  26.0 - 34.0 pg   MCHC 34.8  30.0 - 36.0 g/dL   RDW 12.2  11.5 - 15.5 %   Platelets 294  150 - 400 K/uL  BASIC METABOLIC PANEL      Result Value Ref Range   Sodium 138  137 - 147 mEq/L   Potassium 3.6 (*) 3.7 - 5.3 mEq/L   Chloride 99  96 - 112 mEq/L   CO2 26  19 - 32 mEq/L   Glucose, Bld 94  70 - 99 mg/dL   BUN 4 (*) 6 - 23 mg/dL   Creatinine, Ser 0.51  0.50 - 1.35 mg/dL   Calcium 9.8  8.4 - 10.5 mg/dL   GFR calc non Af Amer >90  >90 mL/min   GFR calc Af Amer >90  >90 mL/min   Anion gap 13  5 - 15   A/P: Pt with history of persistent and worsening low back pain and evidence of  acute compression fractures on MRI at L4 and possibly L5 levels. Plan is for L4/possibly L5 VP/KP today. Details/risks of procedure d/w pt with his understanding and consent.

## 2013-12-21 NOTE — Progress Notes (Signed)
UR chart review completed.  

## 2013-12-30 ENCOUNTER — Ambulatory Visit: Payer: Medicare Other | Admitting: Cardiology

## 2014-01-23 ENCOUNTER — Ambulatory Visit: Payer: Medicare Other | Admitting: Cardiology

## 2014-02-02 ENCOUNTER — Encounter: Payer: Self-pay | Admitting: Cardiology

## 2014-02-17 ENCOUNTER — Ambulatory Visit: Payer: Medicare Other | Admitting: Cardiology

## 2014-03-08 ENCOUNTER — Encounter: Payer: Self-pay | Admitting: Cardiology

## 2014-03-08 ENCOUNTER — Ambulatory Visit (INDEPENDENT_AMBULATORY_CARE_PROVIDER_SITE_OTHER): Payer: Medicare Other | Admitting: Cardiology

## 2014-03-08 VITALS — BP 161/88 | HR 85 | Ht 70.0 in | Wt 161.0 lb

## 2014-03-08 DIAGNOSIS — Z86711 Personal history of pulmonary embolism: Secondary | ICD-10-CM

## 2014-03-08 DIAGNOSIS — Z9189 Other specified personal risk factors, not elsewhere classified: Secondary | ICD-10-CM

## 2014-03-08 DIAGNOSIS — R911 Solitary pulmonary nodule: Secondary | ICD-10-CM | POA: Insufficient documentation

## 2014-03-08 DIAGNOSIS — E782 Mixed hyperlipidemia: Secondary | ICD-10-CM

## 2014-03-08 DIAGNOSIS — I2510 Atherosclerotic heart disease of native coronary artery without angina pectoris: Secondary | ICD-10-CM

## 2014-03-08 DIAGNOSIS — Z87898 Personal history of other specified conditions: Secondary | ICD-10-CM

## 2014-03-08 NOTE — Assessment & Plan Note (Signed)
Documented by chest CT and PET imaging as outlined above within the last year. He did have a functional study, specifically exercise Cardiolite in August of last year that showed possible inferior and inferior septal scar, but no active ischemia and preserved LVEF. He does not endorse any active angina symptoms, and would recommend observation for now. Medical therapy includes Norvasc and Pravachol.

## 2014-03-08 NOTE — Assessment & Plan Note (Signed)
No recurrence for greater than 6 months, and he can only recall one episode over time. For now we will continue observation, if he manifests any progressive dizziness or near-syncope in the interim, additional cardiac monitoring can be arranged.

## 2014-03-08 NOTE — Patient Instructions (Signed)

## 2014-03-08 NOTE — Progress Notes (Signed)
Clinical Summary Mr. Macomber is a 80 y.o.male last seen in August 2014. I reviewed the chart and find that his interval history includes diagnosis of pulmonary emboli during hospitalization last August 2014, treated with Xarelto. He also was noted to have a right upper lobe pulmonary nodule, and documentation of multivessel distribution coronary atherosclerosis on chest CT imaging. Prior cardiac workup included a low risk abnormal exercise Cardiolite in August 2014 indicating possible scar in the inferior and inferior septal distribution with normal LVEF, no active ischemia. More recently he was in the hospital in July with back pain - he was noted to have compression deformity of L4, no acute fracture, ultimately underwent L4 kyphoplasty.  I see that he did have a PET scan indicating hypermetabolic activity in the right upper lobe nodule concerning for primary bronchogenic carcinoma (study was in November 2014). Patient has been followed by Dr. Edrick Oh, he underwent open resection of this nodule by Dr. Donah Driver at [facility] in August of this year - well-differentiated adenocarcinoma. No further treatment was undertaken, patient states that he is to have followup chest CT imaging for surveillance.  Lab work from July showed hemoglobin 12.5, platelets 294, potassium 3.6, BUN 4, creatinine 0.5.  He comes in today denying any anginal symptoms, has stable dyspnea on exertion. He mentions that over the years he has had sporadic episodes of dizziness, had an episode of syncope while driving greater than 6 months ago without recurrence. States that both of his parents had problems with syncope and in fact his mother had a pacemaker related to what sounds like pauses.   Allergies  Allergen Reactions  . Asa [Aspirin] Anaphylaxis    Current Outpatient Prescriptions  Medication Sig Dispense Refill  . allopurinol (ZYLOPRIM) 300 MG tablet Take 300 mg by mouth daily.      Marland Kitchen amLODipine (NORVASC) 5 MG  tablet Take 5 mg by mouth every morning.       . cholecalciferol (VITAMIN D) 1000 UNITS tablet Take 1,000 Units by mouth every morning.      . Cyanocobalamin (B-12 PO) Take 1 tablet by mouth daily.      Marland Kitchen escitalopram (LEXAPRO) 10 MG tablet Take 10 mg by mouth every morning.       Marland Kitchen esomeprazole (NEXIUM) 40 MG capsule Take 40 mg by mouth every morning.       Marland Kitchen HYDROcodone-acetaminophen (NORCO/VICODIN) 5-325 MG per tablet Take 1 tablet by mouth every 6 (six) hours as needed for pain.  20 tablet  0  . LORazepam (ATIVAN) 0.5 MG tablet Take 0.5 mg by mouth daily as needed for anxiety.      . methocarbamol (ROBAXIN) 500 MG tablet Take 1 tablet (500 mg total) by mouth every 8 (eight) hours as needed for muscle spasms.  30 tablet  0  . Multiple Vitamin (MULTIVITAMIN) tablet Take 1 tablet by mouth daily.      . nitroGLYCERIN (NITROSTAT) 0.4 MG SL tablet Place 1 tablet (0.4 mg total) under the tongue every 5 (five) minutes as needed for chest pain. Up to 3 doses. If no relief after 3rd dose, proceed to ED for evaluation.  25 tablet  3  . pravastatin (PRAVACHOL) 40 MG tablet Take 40 mg by mouth every morning.       . pyridOXINE (VITAMIN B-6) 100 MG tablet Take 100 mg by mouth every morning.       . rivaroxaban (XARELTO) 20 MG TABS tablet Take 20 mg by mouth daily.      Marland Kitchen  vitamin E 400 UNIT capsule Take 400 Units by mouth daily.       No current facility-administered medications for this visit.    Past Medical History  Diagnosis Date  . Essential hypertension, benign   . Hypercholesteremia   . Depression   . GERD (gastroesophageal reflux disease)   . Gout   . Prostate cancer     Status post radioactive seed implantation  . History of pulmonary embolism   . Coronary atherosclerosis     Documented by chest CT and PET imaging  . Lung nodule     Open resection at [facility] August 2015 - well-differentiated adenocarcinoma     Social History Mr. Cumpston reports that he has been smoking  Cigarettes.  He started smoking about 63 years ago. He has a 60 pack-year smoking history. He does not have any smokeless tobacco history on file. Mr. Witts reports that he drinks about 12 ounces of alcohol per week.  Review of Systems Hard of hearing. Other systems reviewed and negative except as outlined.  Physical Examination Filed Vitals:   03/08/14 0857  BP: 161/88  Pulse: 85   Filed Weights   03/08/14 0857  Weight: 161 lb (73.029 kg)    Appears comfortable at rest.  HEENT: Conjunctiva and lids normal, oropharynx clear. Hearing aids in place.  Neck: Supple, no elevated JVP or carotid bruits, no thyromegaly.  Lungs: Clear to auscultation, nonlabored breathing at rest.  Thorax: Well-healed left lateral incisions. Cardiac: Regular rate and rhythm, no S3, soft basal systolic murmur, preserved second heart sound, no pericardial rub.  Abdomen: Soft, nontender, bowel sounds present, no guarding or rebound.  Extremities: No pitting edema, distal pulses 2+.  Skin: Warm and dry. Muscular skeletal: No kyphosis. Neuropsychiatric: Alert and oriented x3, affect appropriate.   Problem List and Plan   Coronary atherosclerosis Documented by chest CT and PET imaging as outlined above within the last year. He did have a functional study, specifically exercise Cardiolite in August of last year that showed possible inferior and inferior septal scar, but no active ischemia and preserved LVEF. He does not endorse any active angina symptoms, and would recommend observation for now. Medical therapy includes Norvasc and Pravachol.  History of syncope No recurrence for greater than 6 months, and he can only recall one episode over time. For now we will continue observation, if he manifests any progressive dizziness or near-syncope in the interim, additional cardiac monitoring can be arranged.  Hx pulmonary embolism He continues on Xarelto, followed by Dr. Edrick Oh.  Mixed hyperlipidemia On  Pravachol, followed by Dr. Edrick Oh.    Satira Sark, M.D., F.A.C.C.

## 2014-03-08 NOTE — Assessment & Plan Note (Signed)
On Pravachol, followed by Dr. Edrick Oh.

## 2014-03-08 NOTE — Assessment & Plan Note (Signed)
He continues on Xarelto, followed by Dr. Edrick Oh.

## 2014-04-21 IMAGING — PT NM PET TUM IMG INITIAL (PI) SKULL BASE T - THIGH
6 series · 25 of 25 positions shown · non-contrast
Comparison: Chest CT 02/04/2013

CLINICAL DATA: Initial treatment strategy for solitary pulmonary
nodule.

NUCLEAR MEDICINE PET SKULL BASE TO THIGH
Fasting Blood Glucose:  107
TECHNIQUE: 19.9 mCi F-18 FDG was injected intravenously. CT data
was obtained and used for attenuation correction and anatomic
localization only.  (This was not acquired as a diagnostic CT
examination.) Additional exam technical data entered on
technologist worksheet.

[Series 1: pet ac · axial · 3.3mm · 4.69mm/px · z∈[-870,+0]mm · 5 of 267 slices shown]
[im 1/267]
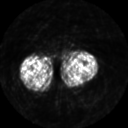
[im 67/267]
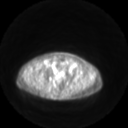
[im 134/267]
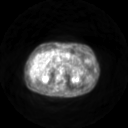
[im 200/267]
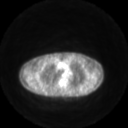
[im 267/267]
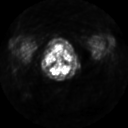

[Series 2: pet nac · axial · 3.3mm · 4.69mm/px · z∈[-870,+0]mm · 6 of 267 slices shown]
[im 1/267]
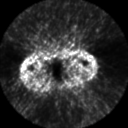
[im 54/267]
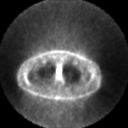
[im 107/267]
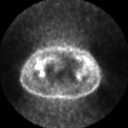
[im 160/267]
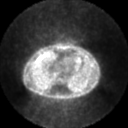
[im 213/267]
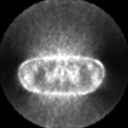
[im 267/267]
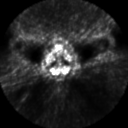

[Series 2: ct images · axial · 3.8mm · 0.98mm/px · z∈[-870,+0]mm · 6 of 267 slices shown]
[im 1/267  bone]
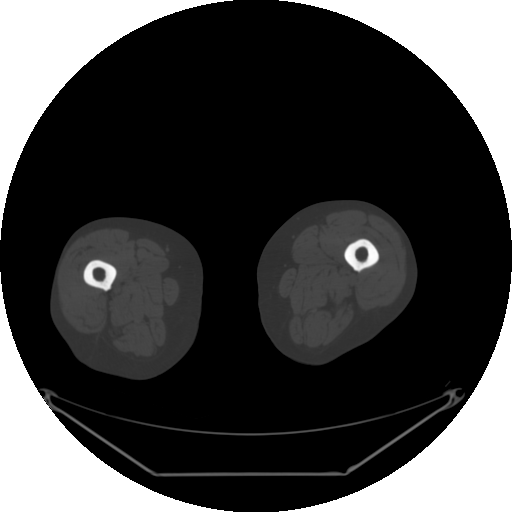
[im 54/267  bone]
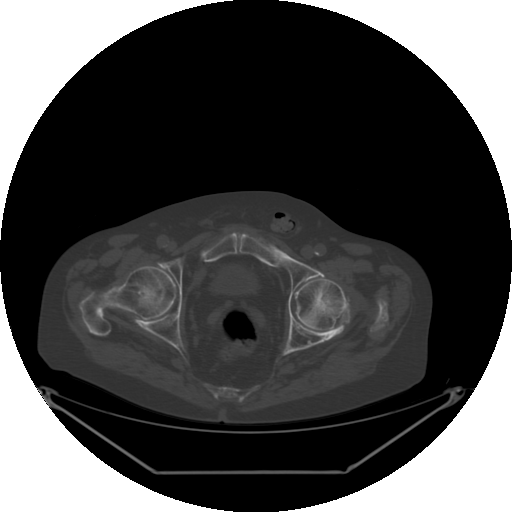
[im 107/267]
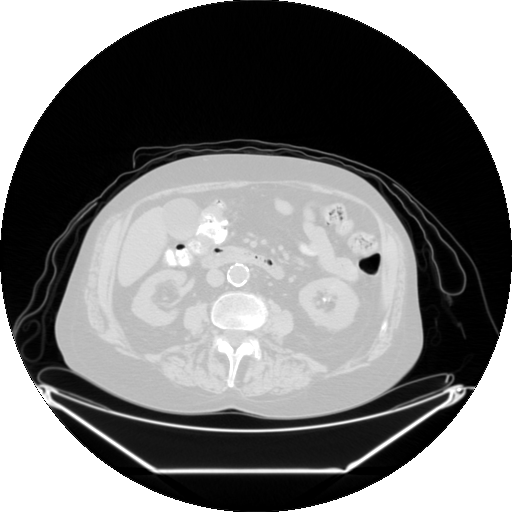
[im 160/267]
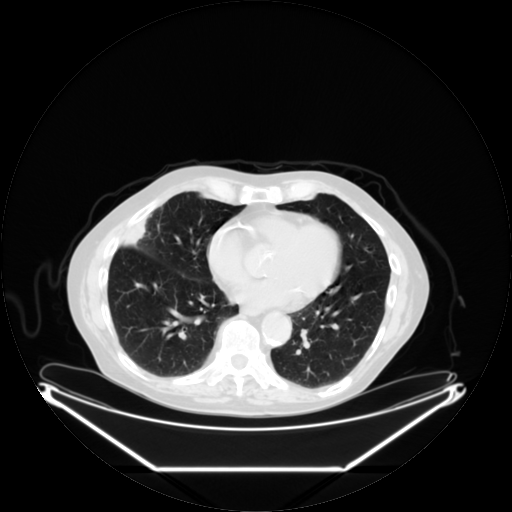
[im 213/267]
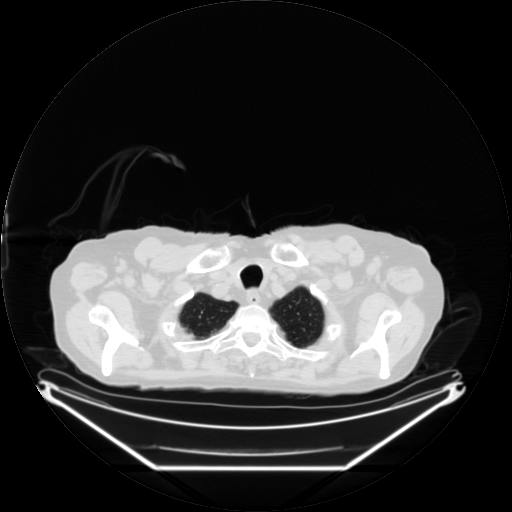
[im 267/267  bone]
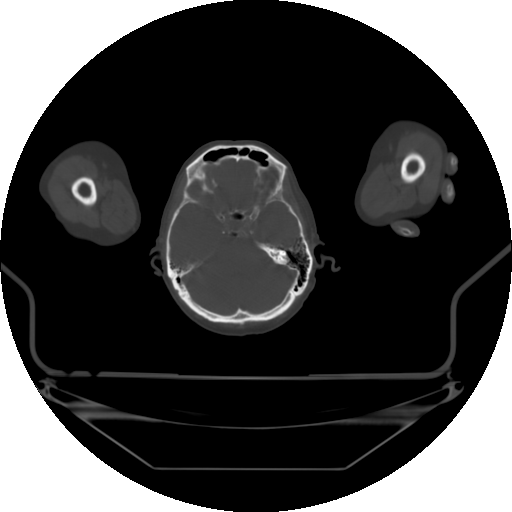

[Series 123: mip · coronal · 3.3mm · 4.69mm/px · 1 of 30 slices shown]
[im 1/30]
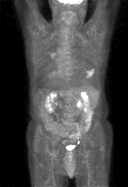

[Series 151: reformatted · axial · 3.3mm · 3.91mm/px · z∈[-870,+0]mm · 6 of 265 slices shown (1 of 2)]
[im 1/265]
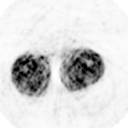
[im 53/265]
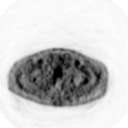
[im 106/265]
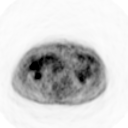
[im 159/265]
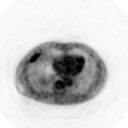
[im 212/265]
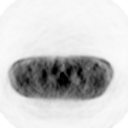
[im 265/265]
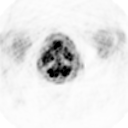

[Series 153: reformatted · coronal · 4.7mm · 6.98mm/px · 1 of 63 slices shown (2 of 2)]
[im 1/63]
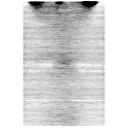

[25 of 25 positions shown; findings below may reference images not displayed]

FINDINGS: Neck: No hypermetabolic lymph nodes in the neck.

Chest:  Slight misregistration artifacts is associated with the
cm sub solid nodule within the right upper lobe. The SUV max
localizing to this area is equal to 2.6 which is in the malignant
range. The solid component of this nodule measures 1.1 cm.

There is progressive peripheral consolidation associated with the
right middle lobe infarct.  There is associated increased FDG
uptake in this area consistent with inflammation/pneumonitis.
Peripheral, subpleural thickening and increased uptake within the
left upper lobe is noted and is also likely related to pulmonary
infarct.
The subpleural thickening within the anterior left upper lobe also
exhibits increased FDG uptake and is likely related to pulmonary
infarct.

The heart size is normal.  No enlarged or hypermetabolic
mediastinal or hilar lymph nodes.  Coronary artery calcifications
identified.

Abdomen/Pelvis:  No abnormal hypermetabolic activity within the
liver, pancreas, adrenal glands, or spleen.  No hypermetabolic
lymph nodes in the abdomen or pelvis. Calcified splenic artery
aneurysm is identified measuring 2.0 cm, image 138.  Renal artery
aneurysm is also noted which appears peripherally calcified
measuring 1.9 cm.  There is a left inguinal hernia which contains
nonobstructed loops of large bowel.  Seed implants are noted within
the prostate gland.  Bilateral nonobstructing renal calculi are
noted.  There are bilateral renal cysts noted.  Advanced calcified
atherosclerotic change involves the abdominal aorta.  No aneurysm.

Skeleton:  No focal hypermetabolic activity to suggest skeletal
metastasis.
IMPRESSION: 1.  Low level, albeit malignant range, FDG uptake is associated
with the sub solid nodule in the right upper lobe.  This remains
suspicious for primary bronchogenic carcinoma (adenocarcinoma).
Recommend tissue sampling.
2.  There is increased FDG uptake associated with the right middle
lobe peripheral infarct and subpleural thickening in the left upper
lobe.  This is likely related to recent pulmonary embolus.
3.  Splenic artery and renal artery aneurysms.
4.  Bilateral nephrolithiasis and bilateral renal cysts.
5.  Prominent calcified atherosclerotic disease including coronary
artery disease.
6.  Left inguinal hernia containing nonobstructed loops of small
bowel.

## 2014-07-16 IMAGING — CR DG CHEST 1V PORT
2 series · 2 of 2 positions shown · non-contrast
Comparison: 02/04/2013

CLINICAL DATA: Left-sided chest pain for 2-3 days, recent history
of pulmonary emboli

EXAM:
PORTABLE CHEST - 1 VIEW

[portable (1 of 2)]
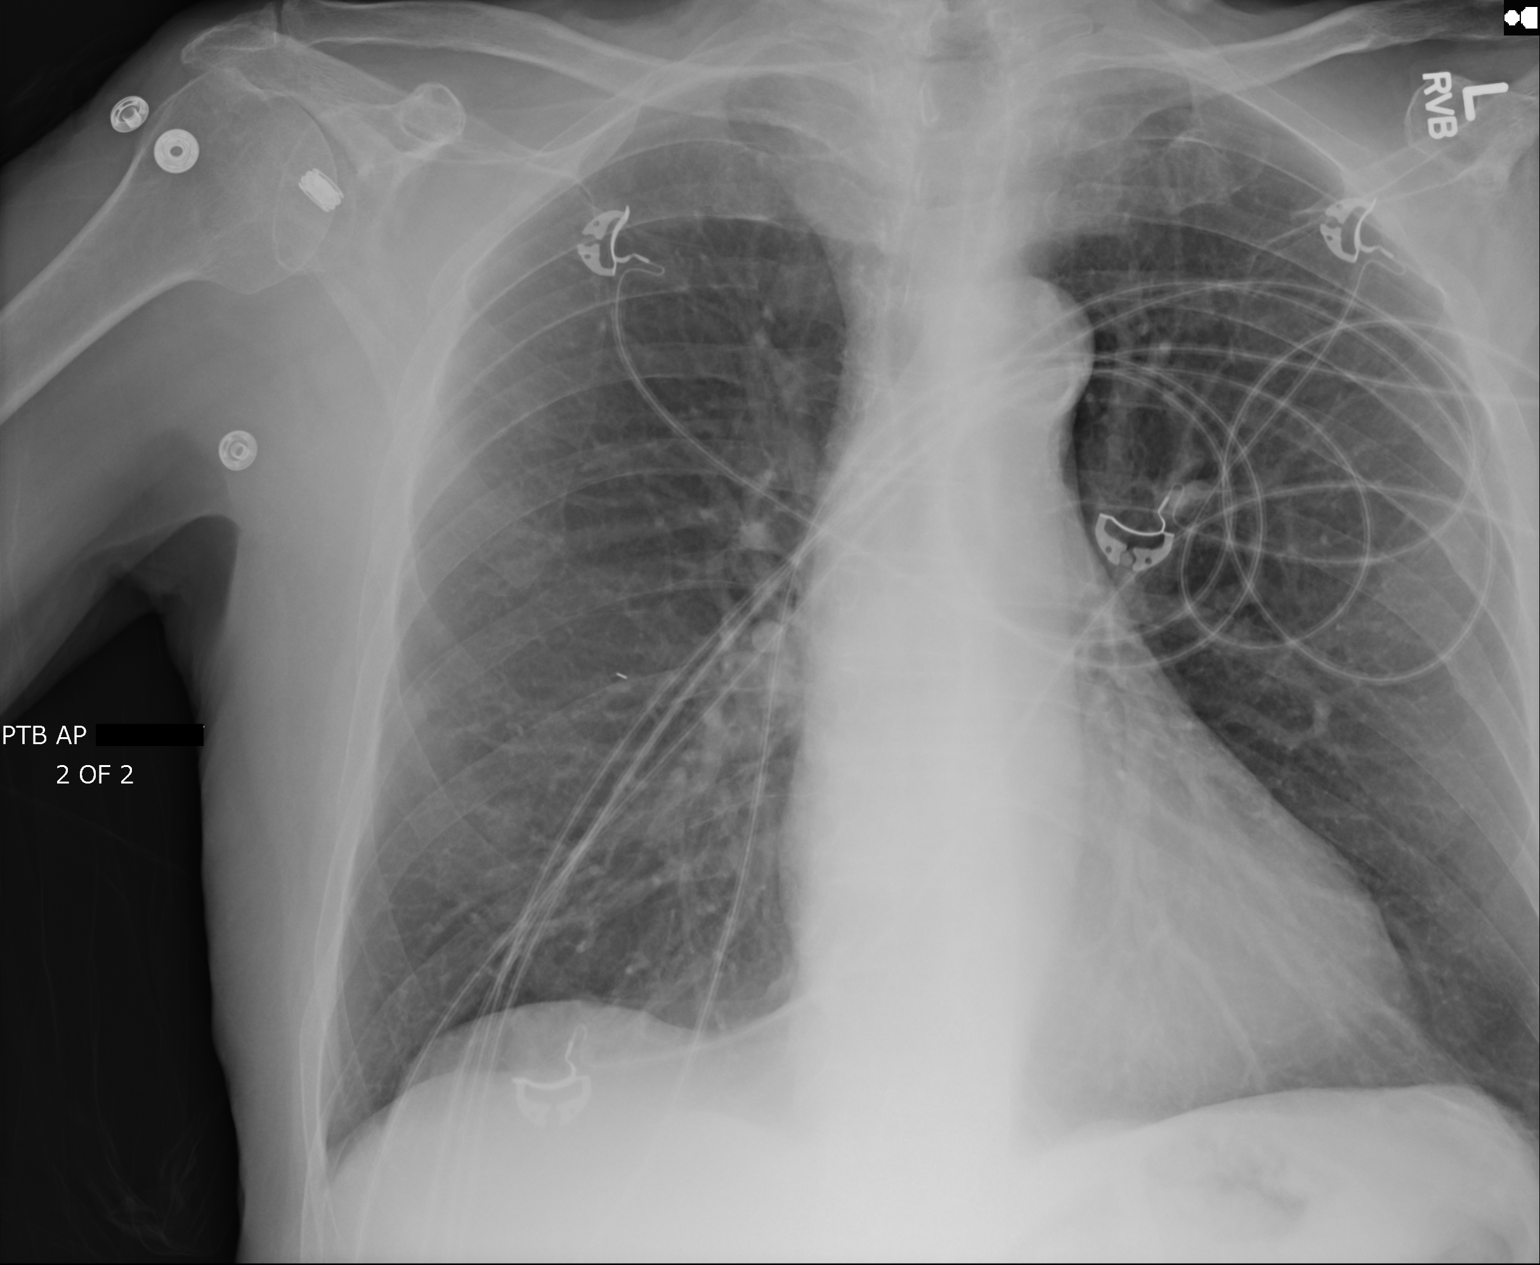

[portable (2 of 2)]
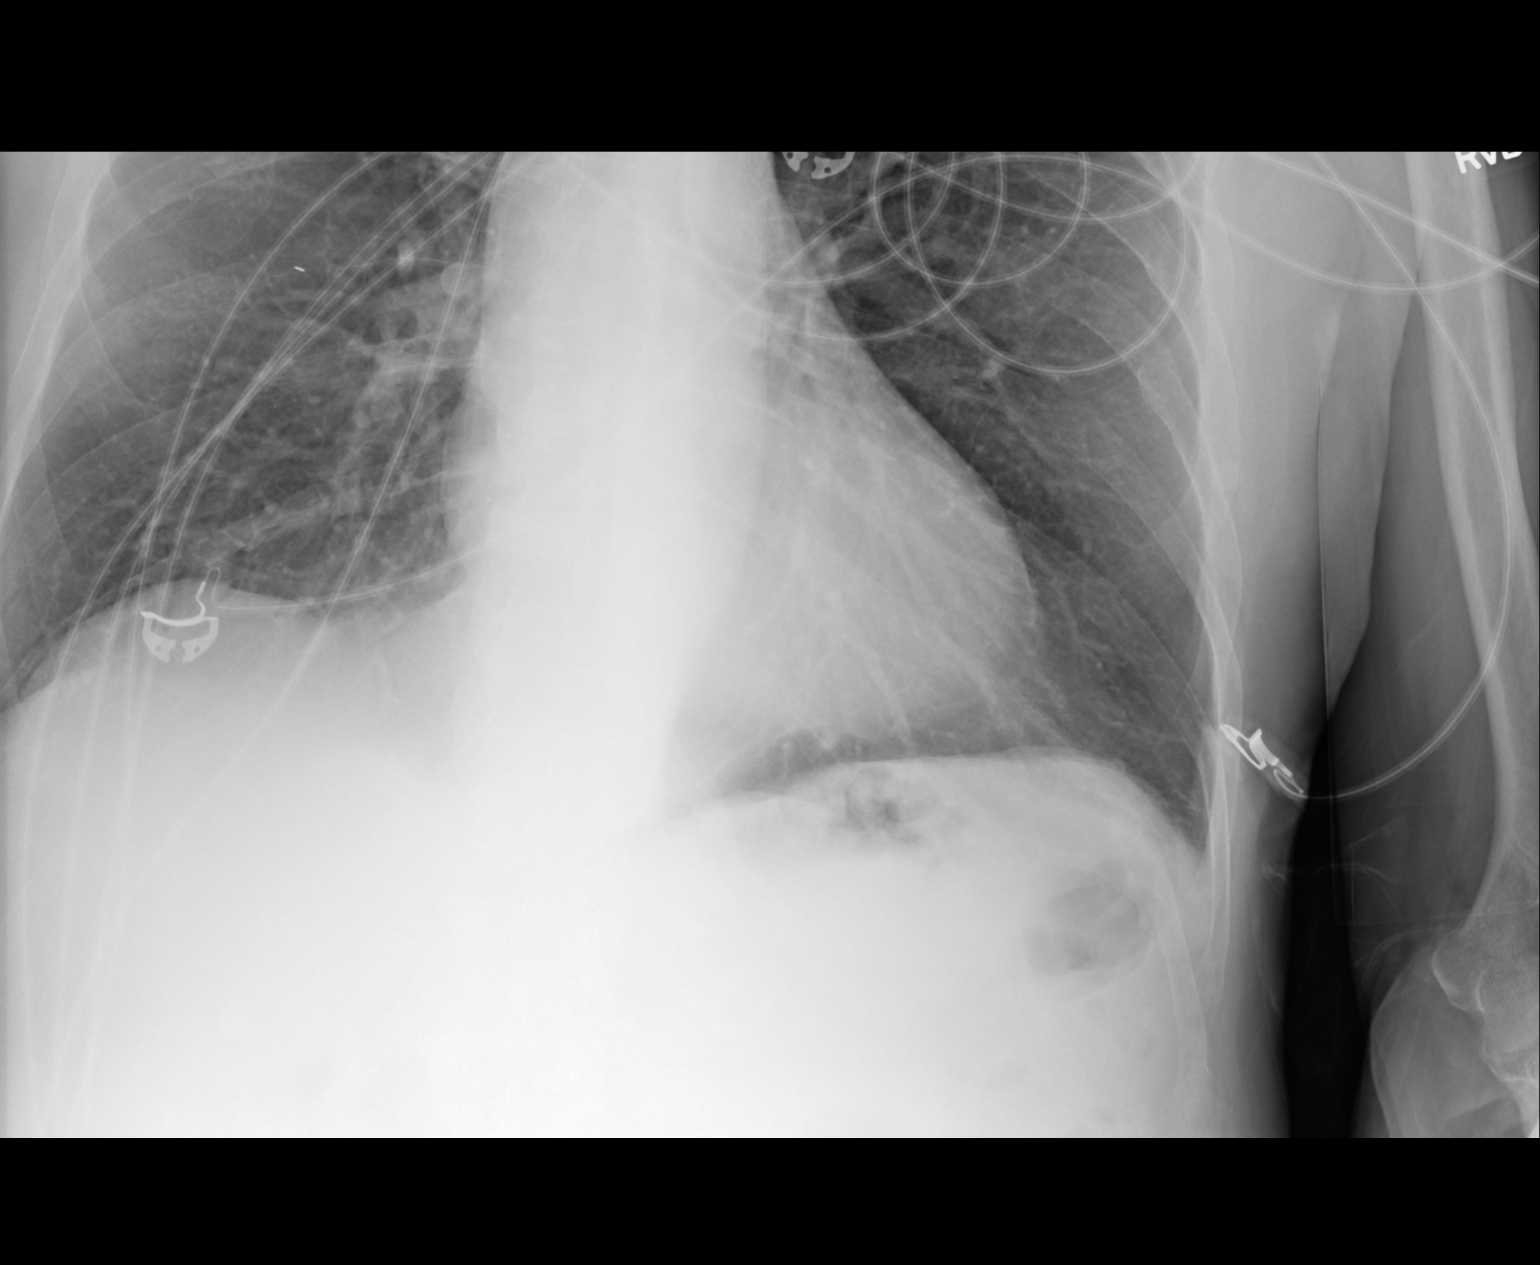

[2 of 2 positions shown; findings below may reference images not displayed]

FINDINGS: The heart size and mediastinal contours are within normal limits.
Both lungs are clear. The visualized skeletal structures are
unremarkable.
IMPRESSION: No active disease.

## 2015-03-02 ENCOUNTER — Emergency Department (HOSPITAL_COMMUNITY): Payer: Medicare Other

## 2015-03-02 ENCOUNTER — Encounter (HOSPITAL_COMMUNITY): Payer: Self-pay

## 2015-03-02 ENCOUNTER — Emergency Department (HOSPITAL_COMMUNITY)
Admission: EM | Admit: 2015-03-02 | Discharge: 2015-03-02 | Disposition: A | Discharge status: Home / Self Care | Payer: Medicare Other | Attending: Emergency Medicine | Admitting: Emergency Medicine

## 2015-03-02 DIAGNOSIS — Z720 Tobacco use: Secondary | ICD-10-CM | POA: Insufficient documentation

## 2015-03-02 DIAGNOSIS — R0981 Nasal congestion: Secondary | ICD-10-CM | POA: Insufficient documentation

## 2015-03-02 DIAGNOSIS — I10 Essential (primary) hypertension: Secondary | ICD-10-CM | POA: Insufficient documentation

## 2015-03-02 DIAGNOSIS — Z79899 Other long term (current) drug therapy: Secondary | ICD-10-CM | POA: Insufficient documentation

## 2015-03-02 DIAGNOSIS — R0600 Dyspnea, unspecified: Secondary | ICD-10-CM | POA: Diagnosis not present

## 2015-03-02 DIAGNOSIS — Z8546 Personal history of malignant neoplasm of prostate: Secondary | ICD-10-CM | POA: Insufficient documentation

## 2015-03-02 DIAGNOSIS — N390 Urinary tract infection, site not specified: Secondary | ICD-10-CM | POA: Diagnosis not present

## 2015-03-02 DIAGNOSIS — J029 Acute pharyngitis, unspecified: Secondary | ICD-10-CM | POA: Insufficient documentation

## 2015-03-02 DIAGNOSIS — Z7901 Long term (current) use of anticoagulants: Secondary | ICD-10-CM | POA: Insufficient documentation

## 2015-03-02 DIAGNOSIS — M109 Gout, unspecified: Secondary | ICD-10-CM | POA: Diagnosis not present

## 2015-03-02 DIAGNOSIS — E780 Pure hypercholesterolemia: Secondary | ICD-10-CM | POA: Insufficient documentation

## 2015-03-02 DIAGNOSIS — F329 Major depressive disorder, single episode, unspecified: Secondary | ICD-10-CM | POA: Insufficient documentation

## 2015-03-02 DIAGNOSIS — J3489 Other specified disorders of nose and nasal sinuses: Secondary | ICD-10-CM | POA: Insufficient documentation

## 2015-03-02 DIAGNOSIS — Z86711 Personal history of pulmonary embolism: Secondary | ICD-10-CM | POA: Insufficient documentation

## 2015-03-02 DIAGNOSIS — I2510 Atherosclerotic heart disease of native coronary artery without angina pectoris: Secondary | ICD-10-CM | POA: Insufficient documentation

## 2015-03-02 DIAGNOSIS — R05 Cough: Secondary | ICD-10-CM | POA: Insufficient documentation

## 2015-03-02 DIAGNOSIS — R0982 Postnasal drip: Secondary | ICD-10-CM | POA: Insufficient documentation

## 2015-03-02 DIAGNOSIS — R0602 Shortness of breath: Secondary | ICD-10-CM | POA: Diagnosis present

## 2015-03-02 DIAGNOSIS — R062 Wheezing: Secondary | ICD-10-CM | POA: Diagnosis not present

## 2015-03-02 DIAGNOSIS — K219 Gastro-esophageal reflux disease without esophagitis: Secondary | ICD-10-CM | POA: Diagnosis not present

## 2015-03-02 LAB — CBC WITH DIFFERENTIAL/PLATELET
BASOS PCT: 1 %
Basophils Absolute: 0 10*3/uL (ref 0.0–0.1)
Eosinophils Absolute: 0 10*3/uL (ref 0.0–0.7)
Eosinophils Relative: 0 %
HEMATOCRIT: 42 % (ref 39.0–52.0)
HEMOGLOBIN: 14.9 g/dL (ref 13.0–17.0)
LYMPHS ABS: 1.8 10*3/uL (ref 0.7–4.0)
LYMPHS PCT: 22 %
MCH: 36.1 pg — ABNORMAL HIGH (ref 26.0–34.0)
MCHC: 35.5 g/dL (ref 30.0–36.0)
MCV: 101.7 fL — AB (ref 78.0–100.0)
MONO ABS: 0.9 10*3/uL (ref 0.1–1.0)
MONOS PCT: 11 %
NEUTROS PCT: 66 %
Neutro Abs: 5.6 10*3/uL (ref 1.7–7.7)
Platelets: 139 10*3/uL — ABNORMAL LOW (ref 150–400)
RBC: 4.13 MIL/uL — ABNORMAL LOW (ref 4.22–5.81)
RDW: 12.9 % (ref 11.5–15.5)
WBC: 8.3 10*3/uL (ref 4.0–10.5)

## 2015-03-02 LAB — COMPREHENSIVE METABOLIC PANEL
ALBUMIN: 4.4 g/dL (ref 3.5–5.0)
ALT: 69 U/L — ABNORMAL HIGH (ref 17–63)
ANION GAP: 14 (ref 5–15)
AST: 162 U/L — ABNORMAL HIGH (ref 15–41)
Alkaline Phosphatase: 114 U/L (ref 38–126)
BILIRUBIN TOTAL: 2.4 mg/dL — AB (ref 0.3–1.2)
BUN: 10 mg/dL (ref 6–20)
CALCIUM: 9.1 mg/dL (ref 8.9–10.3)
CO2: 27 mmol/L (ref 22–32)
Chloride: 96 mmol/L — ABNORMAL LOW (ref 101–111)
Creatinine, Ser: 0.96 mg/dL (ref 0.61–1.24)
GFR calc Af Amer: >60 mL/min (ref >60)
GFR calc non Af Amer: >60 mL/min (ref >60)
GLUCOSE: 118 mg/dL — AB (ref 65–99)
Potassium: 4.2 mmol/L (ref 3.5–5.1)
Sodium: 137 mmol/L (ref 135–145)
TOTAL PROTEIN: 7.6 g/dL (ref 6.5–8.1)

## 2015-03-02 LAB — URINALYSIS, ROUTINE W REFLEX MICROSCOPIC
GLUCOSE, UA: 100 mg/dL — AB
Ketones, ur: 40 mg/dL — AB
Nitrite: POSITIVE — AB
Protein, ur: 100 mg/dL — AB
Urobilinogen, UA: 2 mg/dL — ABNORMAL HIGH (ref 0.0–1.0)
pH: 6 (ref 5.0–8.0)

## 2015-03-02 LAB — URINE MICROSCOPIC-ADD ON

## 2015-03-02 LAB — BRAIN NATRIURETIC PEPTIDE: B Natriuretic Peptide: 106 pg/mL — ABNORMAL HIGH (ref 0.0–100.0)

## 2015-03-02 LAB — TROPONIN I: Troponin I: <0.03 ng/mL (ref <0.031)

## 2015-03-02 MED ORDER — ALBUTEROL SULFATE (2.5 MG/3ML) 0.083% IN NEBU
5.0000 mg | INHALATION_SOLUTION | Freq: Once | RESPIRATORY_TRACT | Status: AC
  Administered 2015-03-02: 5 mg via RESPIRATORY_TRACT
  Filled 2015-03-02: qty 6

## 2015-03-02 MED ORDER — CEPHALEXIN 500 MG PO CAPS: 500.0000 mg | ORAL_CAPSULE | Freq: Three times a day (TID) | ORAL | Status: DC

## 2015-03-02 MED ORDER — SODIUM CHLORIDE 0.9 % IV BOLUS (SEPSIS)
250.0000 mL | Freq: Once | INTRAVENOUS | Status: AC
Start: 2015-03-02 — End: 2015-03-02
  Administered 2015-03-02: 250 mL via INTRAVENOUS

## 2015-03-02 MED ORDER — ALBUTEROL SULFATE HFA 108 (90 BASE) MCG/ACT IN AERS: 2.0000 | INHALATION_SPRAY | RESPIRATORY_TRACT | Status: DC | PRN

## 2015-03-02 MED ORDER — IOHEXOL 350 MG/ML SOLN
100.0000 mL | Freq: Once | INTRAVENOUS | Status: AC | PRN
  Administered 2015-03-02: 100 mL via INTRAVENOUS

## 2015-03-02 MED ORDER — CEFTRIAXONE SODIUM 1 G IJ SOLR
1.0000 g | Freq: Once | INTRAMUSCULAR | Status: AC
  Administered 2015-03-02: 1 g via INTRAVENOUS
  Filled 2015-03-02: qty 10

## 2015-03-02 NOTE — ED Notes (Signed)
Pt brought in by EMS due to worsening of weakness and SOB. Pt reports that he has been SOB and coughing for 2 weeks worsening over the last 3 days. Productive cough with yellow green mucous.

## 2015-03-02 NOTE — Discharge Instructions (Signed)
It was our pleasure to provide your ER care today - we hope that you feel better.  Use albuterol inhaler as need if wheezing.  Avoid any smoking.  For urine infection, take antibiotic (keflex) as prescribed.  Follow up with primary care doctor in the next few days for recheck.  Make sure to discuss with your doctor whether or not they want to restart you on your blood thinner medication.   From today's lab tests, are couple of your liver function tests are elevated (AST 162, ALT 69, bili 2.4) - hold/stop your cholesterol medication, pravastatin, and discuss these results with your primary care doctor at follow up with them.   Your ct scan of your chest today showed no acute process. Several incidental findings were noted - discuss with your primary care doctor at follow up with them (see below). IMPRESSION: 1. Technically adequate exam showing no pulmonary embolus. 2. Coronary artery disease. 3. Bovine thoracic arch with atherosclerotic disease. No aneurysm. 4. Partial right upper lobectomy. 5. Incidental note of small amount of mucus within a subsegmental left lower lobe bronchus. 6. Hepatic steatosis. 7. Bilateral intrarenal calculi. 8. Left renal cyst. 9. 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 The largest patent aneurysm is 1.7 cm.    Return to ER if worse, new symptoms, fevers, chest pain, difficulty breathing, weak/fainting, abdominal pain, vomiting, other concern.       Shortness of Breath Shortness of breath means you have trouble breathing. It could also mean that you have a medical problem. You should get immediate medical care for shortness of breath. CAUSES   Not enough oxygen in the air such as with high altitudes or a smoke-filled room.  Certain lung diseases, infections, or problems.  Heart disease or conditions, such as angina or heart failure.  Low red blood cells (anemia).  Poor physical fitness, which can cause shortness of breath when  you exercise.  Chest or back injuries or stiffness.  Being overweight.  Smoking.  Anxiety, which can make you feel like you are not getting enough air. DIAGNOSIS  Serious medical problems can often be found during your physical exam. Tests may also be done to determine why you are having shortness of breath. Tests may include:  Chest X-rays.  Lung function tests.  Blood tests.  An electrocardiogram (ECG).  An ambulatory electrocardiogram. An ambulatory ECG records your heartbeat patterns over a 24-hour period.  Exercise testing.  A transthoracic echocardiogram (TTE). During echocardiography, sound waves are used to evaluate how blood flows through your heart.  A transesophageal echocardiogram (TEE).  Imaging scans. Your health care provider may not be able to find a cause for your shortness of breath after your exam. In this case, it is important to have a follow-up exam with your health care provider as directed.  TREATMENT  Treatment for shortness of breath depends on the cause of your symptoms and can vary greatly. HOME CARE INSTRUCTIONS   Do not smoke. Smoking is a common cause of shortness of breath. If you smoke, ask for help to quit.  Avoid being around chemicals or things that may bother your breathing, such as paint fumes and dust.  Rest as needed. Slowly resume your usual activities.  If medicines were prescribed, take them as directed for the full length of time directed. This includes oxygen and any inhaled medicines.  Keep all follow-up appointments as directed by your health care provider. SEEK MEDICAL CARE IF:   Your condition does not improve in  the time expected.  You have a hard time doing your normal activities even with rest.  You have any new symptoms. SEEK IMMEDIATE MEDICAL CARE IF:   Your shortness of breath gets worse.  You feel light-headed, faint, or develop a cough not controlled with medicines.  You start coughing up blood.  You  have pain with breathing.  You have chest pain or pain in your arms, shoulders, or abdomen.  You have a fever.  You are unable to walk up stairs or exercise the way you normally do. MAKE SURE YOU:  Understand these instructions.  Will watch your condition.  Will get help right away if you are not doing well or get worse. Document Released: 02/25/2001 Document Revised: 06/07/2013 Document Reviewed: 08/18/2011 [facility] Patient Information 2015 [place], [place]. This information is not intended to replace advice given to you by your health care provider. Make sure you discuss any questions you have with your health care provider.   Smoking Hazards Smoking cigarettes is extremely bad for your health. Tobacco smoke has over 200 known poisons in it. It contains the poisonous gases nitrogen oxide and carbon monoxide. There are over 60 chemicals in tobacco smoke that cause cancer. Some of the chemicals found in cigarette smoke include:   Cyanide.   Benzene.   Formaldehyde.   Methanol (wood alcohol).   Acetylene (fuel used in welding torches).   Ammonia.  Even smoking lightly shortens your life expectancy by several years. You can greatly reduce the risk of medical problems for you and your family by stopping now. Smoking is the most preventable cause of death and disease in our society. Within days of quitting smoking, your circulation improves, you decrease the risk of having a heart attack, and your lung capacity improves. There may be some increased phlegm in the first few days after quitting, and it may take months for your lungs to clear up completely. Quitting for 10 years reduces your risk of developing lung cancer to almost that of a nonsmoker.  WHAT ARE THE RISKS OF SMOKING? Cigarette smokers have an increased risk of many serious medical problems, including:  Lung cancer.   Lung disease (such as pneumonia, bronchitis, and emphysema).   Heart attack and chest pain due  to the heart not getting enough oxygen (angina).   Heart disease and peripheral blood vessel disease.   Hypertension.   Stroke.   Oral cancer (cancer of the lip, mouth, or voice box).   Bladder cancer.   Pancreatic cancer.   Cervical cancer.   Pregnancy complications, including premature birth.   Stillbirths and smaller newborn babies, birth defects, and genetic damage to sperm.   Early menopause.   Lower estrogen level for women.   Infertility.   Facial wrinkles.   Blindness.   Increased risk of broken bones (fractures).   Senile dementia.   Stomach ulcers and internal bleeding.   Delayed wound healing and increased risk of complications during surgery. Because of secondhand smoke exposure, children of smokers have an increased risk of the following:   Sudden infant death syndrome (SIDS).   Respiratory infections.   Lung cancer.   Heart disease.   Ear infections.  WHY IS SMOKING ADDICTIVE? Nicotine is the chemical agent in tobacco that is capable of causing addiction or dependence. When you smoke and inhale, nicotine is absorbed rapidly into the bloodstream through your lungs. Both inhaled and noninhaled nicotine may be addictive.  WHAT ARE THE BENEFITS OF QUITTING?  There are many health  benefits to quitting smoking. Some are:   The likelihood of developing cancer and heart disease decreases. Health improvements are seen almost immediately.   Blood pressure, pulse rate, and breathing patterns start returning to normal soon after quitting.   People who quit may see an improvement in their overall quality of life.  HOW DO YOU QUIT SMOKING? Smoking is an addiction with both physical and psychological effects, and longtime habits can be hard to change. Your health care provider can recommend:  Programs and community resources, which may include group support, education, or therapy.  Replacement products, such as patches, gum, and  nasal sprays. Use these products only as directed. Do not replace cigarette smoking with electronic cigarettes (commonly called e-cigarettes). The safety of e-cigarettes is unknown, and some may contain harmful chemicals. FOR MORE INFORMATION  American Lung Association: www.lung.org  American Cancer Society: www.cancer.org Document Released: 07/10/2004 Document Revised: 03/23/2013 Document Reviewed: 11/22/2012 [facility] Patient Information 2015 [place], [place]. This information is not intended to replace advice given to you by your health care provider. Make sure you discuss any questions you have with your health care provider.   Urinary Tract Infection Urinary tract infections (UTIs) can develop anywhere along your urinary tract. Your urinary tract is your body's drainage system for removing wastes and extra water. Your urinary tract includes two kidneys, two ureters, a bladder, and a urethra. Your kidneys are a pair of bean-shaped organs. Each kidney is about the size of your fist. They are located below your ribs, one on each side of your spine. CAUSES Infections are caused by microbes, which are microscopic organisms, including fungi, viruses, and bacteria. These organisms are so small that they can only be seen through a microscope. Bacteria are the microbes that most commonly cause UTIs. SYMPTOMS  Symptoms of UTIs may vary by age and gender of the patient and by the location of the infection. Symptoms in young women typically include a frequent and intense urge to urinate and a painful, burning feeling in the bladder or urethra during urination. Older women and men are more likely to be tired, shaky, and weak and have muscle aches and abdominal pain. A fever may mean the infection is in your kidneys. Other symptoms of a kidney infection include pain in your back or sides below the ribs, nausea, and vomiting. DIAGNOSIS To diagnose a UTI, your caregiver will ask you about your symptoms. Your  caregiver also will ask to provide a urine sample. The urine sample will be tested for bacteria and white blood cells. White blood cells are made by your body to help fight infection. TREATMENT  Typically, UTIs can be treated with medication. Because most UTIs are caused by a bacterial infection, they usually can be treated with the use of antibiotics. The choice of antibiotic and length of treatment depend on your symptoms and the type of bacteria causing your infection. HOME CARE INSTRUCTIONS  If you were prescribed antibiotics, take them exactly as your caregiver instructs you. Finish the medication even if you feel better after you have only taken some of the medication.  Drink enough water and fluids to keep your urine clear or pale yellow.  Avoid caffeine, tea, and carbonated beverages. They tend to irritate your bladder.  Empty your bladder often. Avoid holding urine for long periods of time.  Empty your bladder before and after sexual intercourse.  After a bowel movement, women should cleanse from front to back. Use each tissue only once. SEEK MEDICAL CARE IF:  You have back pain.  You develop a fever.  Your symptoms do not begin to resolve within 3 days. SEEK IMMEDIATE MEDICAL CARE IF:   You have severe back pain or lower abdominal pain.  You develop chills.  You have nausea or vomiting.  You have continued burning or discomfort with urination. MAKE SURE YOU:   Understand these instructions.  Will watch your condition.  Will get help right away if you are not doing well or get worse. Document Released: 03/12/2005 Document Revised: 12/02/2011 Document Reviewed: 07/11/2011 [facility] Patient Information 2015 [place], [place]. This information is not intended to replace advice given to you by your health care provider. Make sure you discuss any questions you have with your health care provider.

## 2015-03-02 NOTE — ED Provider Notes (Addendum)
CSN: [id number]     Arrival date & time 03/02/15  1228 History  This chart was scribed for Lajean Saver, MD by Starleen Arms, ED Scribe. This patient was seen in room APA02/APA02 and the patient's care was started at 12:36 PM.   Chief Complaint  Patient presents with  . Shortness of Breath   The history is provided by the patient. No language interpreter was used.   HPI Comments: Jonathon Conway is a 81 y.o. male brought in by EMS who presents to the Emergency Department complaining of intermittent, worsening, currently mild SOB onset several weeks ago, worse at night.  Associated symptoms include productive cough, wheezing (worse at night), rhinorrhea, sore throat, post nasal drip, decreased appetite, night sweats.  He was seen by his PCP two weeks ago where there was no discussion of today's complaints.  Patient denies current use of anti-coagulants, inhaler, breathing machine.  Patient had a lung nodule, path c/w well diff adenocarcinoma, removed last year with no complications since - is supposed to have yearly CTs to monitor, but hasnt followed up.  Patient is a current smoker.  He denies CP, leg swelling, weight loss. No fevers. No wt loss.  Hx PE, states off anticoag x 3-4 months.       PCP: Dr. Edrick Oh   Past Medical History  Diagnosis Date  . Essential hypertension, benign   . Hypercholesteremia   . Depression   . GERD (gastroesophageal reflux disease)   . Gout   . Prostate cancer     Status post radioactive seed implantation  . History of pulmonary embolism   . Coronary atherosclerosis     Documented by chest CT and PET imaging  . Lung nodule     Open resection at [facility] August 2015 - well-differentiated adenocarcinoma    Past Surgical History  Procedure Laterality Date  . Fracture surgery    . Hemorrhoid surgery    . Hernia repair    . Prostate surgery      Prostate seed implantation  . Left elbow surgery    . Tonsillectomy    . Pulmonary nodule resection      Family History  Problem Relation Age of Onset  . Arrhythmia Mother     Pacemaker  . CAD Brother     CABG   Social History  Substance Use Topics  . Smoking status: Current Some Day Smoker -- 1.00 packs/day for 60 years    Types: Cigarettes    Start date: 03/09/1949  . Smokeless tobacco: Not on file  . Alcohol Use: 12.0 oz/week    24 drink(s) per week     Comment: Occassional wine    Review of Systems  Constitutional: Negative for fever.  HENT: Positive for congestion.   Eyes: Negative for redness.  Respiratory: Positive for cough and shortness of breath.   Cardiovascular: Negative for chest pain.  Gastrointestinal: Negative for abdominal pain.  Genitourinary: Negative for flank pain.  Musculoskeletal: Negative for back pain and neck pain.  Skin: Negative for rash.  Neurological: Negative for headaches.  Hematological: Does not bruise/bleed easily.  Psychiatric/Behavioral: Negative for confusion.   A complete 10 system review of systems was obtained and all systems are negative except as noted in the HPI and PMH.    Allergies  Asa  Home Medications   Prior to Admission medications   Medication Sig Start Date End Date Taking? Authorizing Provider  allopurinol (ZYLOPRIM) 300 MG tablet Take 300 mg by mouth daily.  Historical Provider, MD  amLODipine (NORVASC) 5 MG tablet Take 5 mg by mouth every morning.     Historical Provider, MD  cholecalciferol (VITAMIN D) 1000 UNITS tablet Take 1,000 Units by mouth every morning.    Historical Provider, MD  Cyanocobalamin (B-12 PO) Take 1 tablet by mouth daily.    Historical Provider, MD  escitalopram (LEXAPRO) 10 MG tablet Take 10 mg by mouth every morning.     Historical Provider, MD  esomeprazole (NEXIUM) 40 MG capsule Take 40 mg by mouth every morning.     Historical Provider, MD  HYDROcodone-acetaminophen (NORCO/VICODIN) 5-325 MG per tablet Take 1 tablet by mouth every 6 (six) hours as needed for pain. 02/07/13   Samuella Cota, MD  LORazepam (ATIVAN) 0.5 MG tablet Take 0.5 mg by mouth daily as needed for anxiety.    Historical Provider, MD  methocarbamol (ROBAXIN) 500 MG tablet Take 1 tablet (500 mg total) by mouth every 8 (eight) hours as needed for muscle spasms. 12/20/13   Maryann Mikhail, DO  Multiple Vitamin (MULTIVITAMIN) tablet Take 1 tablet by mouth daily.    Historical Provider, MD  nitroGLYCERIN (NITROSTAT) 0.4 MG SL tablet Place 1 tablet (0.4 mg total) under the tongue every 5 (five) minutes as needed for chest pain. Up to 3 doses. If no relief after 3rd dose, proceed to ED for evaluation. 01/20/13   Donney Dice, PA-C  pravastatin (PRAVACHOL) 40 MG tablet Take 40 mg by mouth every morning.     Historical Provider, MD  pyridOXINE (VITAMIN B-6) 100 MG tablet Take 100 mg by mouth every morning.     Historical Provider, MD  rivaroxaban (XARELTO) 20 MG TABS tablet Take 20 mg by mouth daily.    Historical Provider, MD  vitamin E 400 UNIT capsule Take 400 Units by mouth daily.    Historical Provider, MD   BP 153/96 mmHg  Pulse 81  Temp(Src) 98 F (36.7 C) (Oral)  Resp 15  Ht 5\' 9"  (1.753 m)  Wt 160 lb (72.576 kg)  BMI 23.62 kg/m2  SpO2 94% Physical Exam  Constitutional: He is oriented to person, place, and time. He appears well-developed and well-nourished. No distress.  HENT:  Head: Normocephalic and atraumatic.  Nose: Nose normal.  Mouth/Throat: Oropharynx is clear and moist. No oropharyngeal exudate.  Eyes: Conjunctivae and EOM are normal. No scleral icterus.  Neck: Neck supple. No tracheal deviation present.  Cardiovascular: Normal rate, regular rhythm, normal heart sounds and intact distal pulses.  Exam reveals no gallop and no friction rub.   No murmur heard. Pulmonary/Chest: Effort normal. No respiratory distress.  Abdominal: Soft. He exhibits no distension. There is no tenderness.  Genitourinary:  No cva tenderness  Musculoskeletal: Normal range of motion. He exhibits no edema or  tenderness.  Neurological: He is alert and oriented to person, place, and time.  Skin: Skin is warm and dry. No rash noted. He is not diaphoretic.  Psychiatric: He has a normal mood and affect. His behavior is normal.  Nursing note and vitals reviewed.   ED Course  Procedures (including critical care time)  DIAGNOSTIC STUDIES: Oxygen Saturation is 94% on RA, normal by my interpretation.    COORDINATION OF CARE:  12:59 PM Discussed treatment plan with patient at bedside.  Patient acknowledges and agrees with plan.    Results for orders placed or performed during the hospital encounter of 03/02/15  CBC with Differential  Result Value Ref Range   WBC 8.3 4.0 - 10.5  K/uL   RBC 4.13 (L) 4.22 - 5.81 MIL/uL   Hemoglobin 14.9 13.0 - 17.0 g/dL   HCT 42.0 39.0 - 52.0 %   MCV 101.7 (H) 78.0 - 100.0 fL   MCH 36.1 (H) 26.0 - 34.0 pg   MCHC 35.5 30.0 - 36.0 g/dL   RDW 12.9 11.5 - 15.5 %   Platelets 139 (L) 150 - 400 K/uL   Neutrophils Relative % 66 %   Neutro Abs 5.6 1.7 - 7.7 K/uL   Lymphocytes Relative 22 %   Lymphs Abs 1.8 0.7 - 4.0 K/uL   Monocytes Relative 11 %   Monocytes Absolute 0.9 0.1 - 1.0 K/uL   Eosinophils Relative 0 %   Eosinophils Absolute 0.0 0.0 - 0.7 K/uL   Basophils Relative 1 %   Basophils Absolute 0.0 0.0 - 0.1 K/uL  Comprehensive metabolic panel  Result Value Ref Range   Sodium 137 135 - 145 mmol/L   Potassium 4.2 3.5 - 5.1 mmol/L   Chloride 96 (L) 101 - 111 mmol/L   CO2 27 22 - 32 mmol/L   Glucose, Bld 118 (H) 65 - 99 mg/dL   BUN 10 6 - 20 mg/dL   Creatinine, Ser 0.96 0.61 - 1.24 mg/dL   Calcium 9.1 8.9 - 10.3 mg/dL   Total Protein 7.6 6.5 - 8.1 g/dL   Albumin 4.4 3.5 - 5.0 g/dL   AST 162 (H) 15 - 41 U/L   ALT 69 (H) 17 - 63 U/L   Alkaline Phosphatase 114 38 - 126 U/L   Total Bilirubin 2.4 (H) 0.3 - 1.2 mg/dL   GFR calc non Af Amer >60 >60 mL/min   GFR calc Af Amer >60 >60 mL/min   Anion gap 14 5 - 15  Brain natriuretic peptide  Result Value Ref  Range   B Natriuretic Peptide 106.0 (H) 0.0 - 100.0 pg/mL  Troponin I  Result Value Ref Range   Troponin I <0.03 <0.031 ng/mL  Urinalysis, Routine w reflex microscopic (not at [facility])  Result Value Ref Range   Color, Urine BROWN (A) YELLOW   APPearance HAZY (A) CLEAR   Specific Gravity, Urine >1.030 (H) 1.005 - 1.030   pH 6.0 5.0 - 8.0   Glucose, UA 100 (A) NEGATIVE mg/dL   Hgb urine dipstick MODERATE (A) NEGATIVE   Bilirubin Urine LARGE (A) NEGATIVE   Ketones, ur 40 (A) NEGATIVE mg/dL   Protein, ur 100 (A) NEGATIVE mg/dL   Urobilinogen, UA 2.0 (H) 0.0 - 1.0 mg/dL   Nitrite POSITIVE (A) NEGATIVE   Leukocytes, UA TRACE (A) NEGATIVE  Urine microscopic-add on  Result Value Ref Range   Squamous Epithelial / LPF RARE RARE   WBC, UA 7-10 <3 WBC/hpf   RBC / HPF 7-10 <3 RBC/hpf   Bacteria, UA MANY (A) RARE   Casts HYALINE CASTS (A) NEGATIVE   Urine-Other MUCOUS PRESENT    Dg Chest 2 View  03/02/2015   CLINICAL DATA:  Weakness. Shortness of breath and cough for 2 weeks. Symptoms worse over the past 3 days. History of hypertension, prostate cancer, smoking.  EXAM: CHEST  2 VIEW  COMPARISON:  02/02/2014  FINDINGS: Heart size is normal. Aorta is tortuous. There is mild volume loss on the right. Lungs sutures are identified in the right upper lobe and right mid lung zone. Lungs are otherwise clear. No pulmonary edema. No pleural effusions.  IMPRESSION: No evidence for acute cardiopulmonary abnormality.   Electronically Signed   By: Benjamine Mola  Owens Shark M.D.   On: 03/02/2015 13:34   Ct Angio Chest Pe W/cm &/or Wo Cm  03/02/2015   CLINICAL DATA:  Intermittent worsening. Mild shortness of breath began several weeks ago, worse at night. Productive cough, wheezing, rhinorrhea, sore throat, postnasal drip, decreased appetite, night sweats. History of smoking. History of PE. History of adenocarcinoma of the lung removed last year. History of prostate cancer and hypertension.  EXAM: CT ANGIOGRAPHY CHEST WITH  CONTRAST  TECHNIQUE: Multidetector CT imaging of the chest was performed using the standard protocol during bolus administration of intravenous contrast. Multiplanar CT image reconstructions and MIPs were obtained to evaluate the vascular anatomy.  CONTRAST:  148mL OMNIPAQUE IOHEXOL 350 MG/ML SOLN  COMPARISON:  03/02/2015, 12/16/2013  FINDINGS: Heart: Heart size is normal. Significant coronary artery calcifications are present. No pericardial effusion.  Vascular structures: Pulmonary arteries are well opacified. There is no acute pulmonary embolus. Bovine arch anatomy. There is atherosclerotic calcification of the thoracic aorta. No aneurysm.  Mediastinum/thyroid: The visualized portion of the thyroid gland has a normal appearance. No mediastinal, hilar, or axillary adenopathy.  Lungs/Airways: There is postoperative change within the right upper lobe following partial lobectomy. Associated scarring/atelectasis is identified. A subsegmental left lower lobe bronchus contains a small amount of mucus. Otherwise airways are patent.  Upper abdomen: The liver is diffusely low attenuation consistent with hepatic steatosis. Bilateral intrarenal calculi are identified. Left renal cyst is partially imaged measuring at least 3.7 cm. There is a splenic artery aneurysm measuring 1.7 cm. A second splenic artery contains a thrombosed aneurysm measuring 2.6 cm.  Chest wall/osseous structures: No suspicious lytic or blastic lesions are identified.  Review of the MIP images confirms the above findings.  IMPRESSION: 1. Technically adequate exam showing no pulmonary embolus. 2. Coronary artery disease. 3. Bovine thoracic arch with atherosclerotic disease.  No aneurysm. 4. Partial right upper lobectomy. 5. Incidental note of small amount of mucus within a subsegmental left lower lobe bronchus. 6. Hepatic steatosis. 7. Bilateral intrarenal calculi. 8. Left renal cyst. 9. 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 The largest  patent aneurysm is 1.7 cm.   Electronically Signed   By: Nolon Nations M.D.   On: 03/02/2015 15:30       EKG Interpretation   Date/Time:  Friday March 02 2015 12:35:05 EDT Ventricular Rate:  85 PR Interval:  142 QRS Duration: 102 QT Interval:  369 QTC Calculation: 439 R Axis:   -78 Text Interpretation:  Sinus rhythm Atrial premature complexes Incomplete  RBBB and LAFB Consider right ventricular hypertrophy Confirmed by Ashok Cordia   MD, Lennette Bihari ([id number]) on 03/02/2015 12:42:20 PM      MDM  I personally performed the services described in this documentation, which was scribed in my presence. The recorded information has been reviewed and considered. Lajean Saver, MD  Reviewed nursing notes and prior charts for additional history.   Iv ns. Labs. Cxr.  UA positive - will culture and rx.  Rocephin iv in ed. Po fluids.  Given hx PE, off anticoag, and c/o dyspnea, will get ct r/o PE.  Ct neg for PE.  No new lung mass/ca noted.  Recheck, mild wheeze - alb neb.   Recheck breathing comfortably. No chest pain. No increased wob. Pt indicates feels ready for d/c to home.  After symptoms present for days, trop neg, no chest pain, ct neg for pe or other acute process. Will have f/u pcp closely, discuss that he has been off anticoag x months and whether  to restart, and discuss other incidental findings on CT.  lfts elev, no abd pain or nv. abd soft nt. Will hold chol med, and have f/u pcp.   Pt currently appears stable for d/c.         Lajean Saver, MD 03/02/15 1719

## 2015-03-02 NOTE — ED Notes (Addendum)
Voided in urinal approx 25 ml of brown urine. UA sent off.Dr Carmelia Roller notified

## 2015-03-04 LAB — URINE CULTURE: CULTURE: NO GROWTH

## 2015-03-25 ENCOUNTER — Encounter (HOSPITAL_COMMUNITY): Payer: Self-pay | Admitting: *Deleted

## 2015-03-25 ENCOUNTER — Emergency Department (HOSPITAL_COMMUNITY)
Admission: EM | Admit: 2015-03-25 | Discharge: 2015-03-25 | Disposition: A | Discharge status: Home / Self Care | Payer: Medicare Other | Attending: Emergency Medicine | Admitting: Emergency Medicine

## 2015-03-25 DIAGNOSIS — I2510 Atherosclerotic heart disease of native coronary artery without angina pectoris: Secondary | ICD-10-CM | POA: Diagnosis not present

## 2015-03-25 DIAGNOSIS — K219 Gastro-esophageal reflux disease without esophagitis: Secondary | ICD-10-CM | POA: Diagnosis not present

## 2015-03-25 DIAGNOSIS — Z8739 Personal history of other diseases of the musculoskeletal system and connective tissue: Secondary | ICD-10-CM | POA: Insufficient documentation

## 2015-03-25 DIAGNOSIS — Z7951 Long term (current) use of inhaled steroids: Secondary | ICD-10-CM | POA: Insufficient documentation

## 2015-03-25 DIAGNOSIS — Z7901 Long term (current) use of anticoagulants: Secondary | ICD-10-CM | POA: Insufficient documentation

## 2015-03-25 DIAGNOSIS — Z86711 Personal history of pulmonary embolism: Secondary | ICD-10-CM | POA: Insufficient documentation

## 2015-03-25 DIAGNOSIS — Z8546 Personal history of malignant neoplasm of prostate: Secondary | ICD-10-CM | POA: Insufficient documentation

## 2015-03-25 DIAGNOSIS — E7800 Pure hypercholesterolemia, unspecified: Secondary | ICD-10-CM | POA: Diagnosis not present

## 2015-03-25 DIAGNOSIS — Z720 Tobacco use: Secondary | ICD-10-CM | POA: Diagnosis not present

## 2015-03-25 DIAGNOSIS — Z79899 Other long term (current) drug therapy: Secondary | ICD-10-CM | POA: Diagnosis not present

## 2015-03-25 DIAGNOSIS — R4182 Altered mental status, unspecified: Secondary | ICD-10-CM | POA: Diagnosis present

## 2015-03-25 DIAGNOSIS — I10 Essential (primary) hypertension: Secondary | ICD-10-CM | POA: Insufficient documentation

## 2015-03-25 DIAGNOSIS — Z792 Long term (current) use of antibiotics: Secondary | ICD-10-CM | POA: Insufficient documentation

## 2015-03-25 DIAGNOSIS — F329 Major depressive disorder, single episode, unspecified: Secondary | ICD-10-CM | POA: Insufficient documentation

## 2015-03-25 DIAGNOSIS — F10129 Alcohol abuse with intoxication, unspecified: Secondary | ICD-10-CM | POA: Insufficient documentation

## 2015-03-25 DIAGNOSIS — IMO0002 Reserved for concepts with insufficient information to code with codable children: Secondary | ICD-10-CM

## 2015-03-25 NOTE — ED Notes (Signed)
Pt brought in by [facility] for c/o ams; pt was found in neighbors yard; pt was just discharged last night from ED for UTI; pt admits to drinking vodka today; pt has skin tear right elbow

## 2015-03-25 NOTE — Discharge Instructions (Signed)
No driving for 24 hours

## 2015-03-25 NOTE — ED Provider Notes (Signed)
CSN: [id number]     Arrival date & time 03/25/15  2108 History   By signing my name below, I, Meriel Pica, attest that this documentation has been prepared under the direction and in the presence of Tanna Furry, MD. Electronically Signed: Meriel Pica, ED Scribe. 03/25/2015. 9:35 PM.   Chief Complaint  Patient presents with  . Altered Mental Status   The history is provided by the patient. The history is limited by the condition of the patient. No language interpreter was used.   LEVEL 5 CAVEAT: AMS SECONDARY TO ETOH CONSUMPTION  HPI Comments: Jonathon Conway is a 81 y.o. male, with a significant PMhx, brought in by ambulance, who presents to the Emergency Department complaining of altered mental status onset today secondary to EtOH intoxication. Per EMS, the pt was found in his car in his neighbors yard intoxicated. He admits to drinking EtOH today after he woke up with generalized shaking. The pt endorses drinking daily and explains he goes through withdrawal when he does not drink.  Pt c/o left hip pain that he has at baseline.   Past Medical History  Diagnosis Date  . Essential hypertension, benign   . Hypercholesteremia   . Depression   . GERD (gastroesophageal reflux disease)   . Gout   . Prostate cancer [facility]     Status post radioactive seed implantation  . History of pulmonary embolism   . Coronary atherosclerosis     Documented by chest CT and PET imaging  . Lung nodule     Open resection at [facility] August 2015 - well-differentiated adenocarcinoma    Past Surgical History  Procedure Laterality Date  . Fracture surgery    . Hemorrhoid surgery    . Hernia repair    . Prostate surgery      Prostate seed implantation  . Left elbow surgery    . Tonsillectomy    . Pulmonary nodule resection     Family History  Problem Relation Age of Onset  . Arrhythmia Mother     Pacemaker  . CAD Brother     CABG   Social History  Substance Use Topics  . Smoking  status: Current Some Day Smoker -- 1.00 packs/day for 60 years    Types: Cigarettes    Start date: 03/09/1949  . Smokeless tobacco: None  . Alcohol Use: 12.0 oz/week    24 drink(s) per week     Comment: Occassional wine    Review of Systems  Unable to perform ROS: Mental status change   Allergies  Asa  Home Medications   Prior to Admission medications   Medication Sig Start Date End Date Taking? Authorizing Provider  acetaminophen (TYLENOL) 500 MG tablet Take 1,000 mg by mouth every 6 (six) hours as needed for moderate pain.    Historical Provider, MD  albuterol (PROVENTIL HFA;VENTOLIN HFA) 108 (90 BASE) MCG/ACT inhaler Inhale 2 puffs into the lungs every 4 (four) hours as needed for wheezing or shortness of breath. 03/02/15   Lajean Saver, MD  cephALEXin (KEFLEX) 500 MG capsule Take 1 capsule (500 mg total) by mouth 3 (three) times daily. 03/02/15   Lajean Saver, MD  cholecalciferol (VITAMIN D) 1000 UNITS tablet Take 1,000 Units by mouth every morning.    Historical Provider, MD  escitalopram (LEXAPRO) 10 MG tablet Take 10 mg by mouth every morning.     Historical Provider, MD  esomeprazole (NEXIUM) 40 MG capsule Take 40 mg by mouth every morning.  Historical Provider, MD  fluticasone (FLONASE) 50 MCG/ACT nasal spray Place 2 sprays into both nostrils daily.    Historical Provider, MD  Multiple Vitamin (MULTIVITAMIN) tablet Take 1 tablet by mouth daily.    Historical Provider, MD  nitroGLYCERIN (NITROSTAT) 0.4 MG SL tablet Place 1 tablet (0.4 mg total) under the tongue every 5 (five) minutes as needed for chest pain. Up to 3 doses. If no relief after 3rd dose, proceed to ED for evaluation. 01/20/13   Donney Dice, PA-C  pravastatin (PRAVACHOL) 40 MG tablet Take 40 mg by mouth every morning.     Historical Provider, MD  pyridOXINE (VITAMIN B-6) 100 MG tablet Take 100 mg by mouth every morning.     Historical Provider, MD  rivaroxaban (XARELTO) 20 MG TABS tablet Take 20 mg by mouth  daily.    Historical Provider, MD  vitamin E 400 UNIT capsule Take 400 Units by mouth daily.    Historical Provider, MD   BP 151/84 mmHg  Pulse 66  Temp(Src) 97.4 F (36.3 C) (Oral)  Resp 18  Ht 5\' 10"  (1.778 m)  Wt 160 lb (72.576 kg)  BMI 22.96 kg/m2  SpO2 100% Physical Exam  Constitutional: He is oriented to person, place, and time. He appears well-developed and well-nourished. No distress.  HENT:  Head: Normocephalic.  Eyes: Conjunctivae are normal. Pupils are equal, round, and reactive to light. No scleral icterus.  Neck: Normal range of motion. Neck supple. No thyromegaly present.  Cardiovascular: Normal rate and regular rhythm.  Exam reveals no gallop and no friction rub.   No murmur heard. Pulmonary/Chest: Effort normal and breath sounds normal. No respiratory distress. He has no wheezes. He has no rales.  Abdominal: Soft. Bowel sounds are normal. He exhibits no distension. There is no tenderness. There is no rebound.  Musculoskeletal: Normal range of motion.  Neurological: He is alert and oriented to person, place, and time.  Skin: Skin is warm and dry. No rash noted.  Psychiatric: He has a normal mood and affect. His behavior is normal.    ED Course  Procedures  DIAGNOSTIC STUDIES: Oxygen Saturation is 100% on RA, normal by my interpretation.    COORDINATION OF CARE: 9:26 PM Discussed treatment plan with pt at bedside and pt agreed to plan.   Labs Review Labs Reviewed - No data to display I have personally reviewed and evaluated these lab results as part of my medical decision-making.   MDM   Final diagnoses:  Intoxication   Has sober relatives here to take him home. No acute medical condition of the intoxication. He is awake alert and conversant.   Tanna Furry, MD 03/25/15 2200

## 2015-04-19 ENCOUNTER — Encounter (HOSPITAL_COMMUNITY): Payer: Self-pay

## 2015-04-19 ENCOUNTER — Emergency Department (HOSPITAL_COMMUNITY): Payer: Medicare Other

## 2015-04-19 ENCOUNTER — Inpatient Hospital Stay (HOSPITAL_COMMUNITY)
Admission: EM | Admit: 2015-04-19 | Discharge: 2015-04-21 | DRG: 312 | Disposition: A | Discharge status: Home / Self Care | Payer: Medicare Other | Attending: Internal Medicine | Admitting: Internal Medicine

## 2015-04-19 DIAGNOSIS — R55 Syncope and collapse: Secondary | ICD-10-CM | POA: Diagnosis not present

## 2015-04-19 DIAGNOSIS — Y92012 Bathroom of single-family (private) house as the place of occurrence of the external cause: Secondary | ICD-10-CM | POA: Diagnosis not present

## 2015-04-19 DIAGNOSIS — F1010 Alcohol abuse, uncomplicated: Secondary | ICD-10-CM | POA: Diagnosis not present

## 2015-04-19 DIAGNOSIS — E860 Dehydration: Secondary | ICD-10-CM | POA: Diagnosis present

## 2015-04-19 DIAGNOSIS — E7800 Pure hypercholesterolemia, unspecified: Secondary | ICD-10-CM | POA: Diagnosis present

## 2015-04-19 DIAGNOSIS — I2510 Atherosclerotic heart disease of native coronary artery without angina pectoris: Secondary | ICD-10-CM

## 2015-04-19 DIAGNOSIS — Z888 Allergy status to other drugs, medicaments and biological substances status: Secondary | ICD-10-CM

## 2015-04-19 DIAGNOSIS — Z902 Acquired absence of lung [part of]: Secondary | ICD-10-CM

## 2015-04-19 DIAGNOSIS — W1830XA Fall on same level, unspecified, initial encounter: Secondary | ICD-10-CM | POA: Diagnosis present

## 2015-04-19 DIAGNOSIS — I951 Orthostatic hypotension: Principal | ICD-10-CM | POA: Diagnosis present

## 2015-04-19 DIAGNOSIS — Z8546 Personal history of malignant neoplasm of prostate: Secondary | ICD-10-CM | POA: Diagnosis not present

## 2015-04-19 DIAGNOSIS — E876 Hypokalemia: Secondary | ICD-10-CM | POA: Diagnosis not present

## 2015-04-19 DIAGNOSIS — K219 Gastro-esophageal reflux disease without esophagitis: Secondary | ICD-10-CM | POA: Diagnosis present

## 2015-04-19 DIAGNOSIS — S2249XA Multiple fractures of ribs, unspecified side, initial encounter for closed fracture: Secondary | ICD-10-CM

## 2015-04-19 DIAGNOSIS — I10 Essential (primary) hypertension: Secondary | ICD-10-CM | POA: Diagnosis present

## 2015-04-19 DIAGNOSIS — Z85118 Personal history of other malignant neoplasm of bronchus and lung: Secondary | ICD-10-CM

## 2015-04-19 DIAGNOSIS — Z7901 Long term (current) use of anticoagulants: Secondary | ICD-10-CM | POA: Diagnosis not present

## 2015-04-19 DIAGNOSIS — I471 Supraventricular tachycardia: Secondary | ICD-10-CM | POA: Diagnosis not present

## 2015-04-19 DIAGNOSIS — S2239XA Fracture of one rib, unspecified side, initial encounter for closed fracture: Secondary | ICD-10-CM

## 2015-04-19 DIAGNOSIS — Z86711 Personal history of pulmonary embolism: Secondary | ICD-10-CM | POA: Diagnosis present

## 2015-04-19 DIAGNOSIS — F17210 Nicotine dependence, cigarettes, uncomplicated: Secondary | ICD-10-CM | POA: Diagnosis not present

## 2015-04-19 DIAGNOSIS — Z23 Encounter for immunization: Secondary | ICD-10-CM | POA: Diagnosis not present

## 2015-04-19 DIAGNOSIS — D531 Other megaloblastic anemias, not elsewhere classified: Secondary | ICD-10-CM | POA: Diagnosis not present

## 2015-04-19 DIAGNOSIS — S2242XA Multiple fractures of ribs, left side, initial encounter for closed fracture: Secondary | ICD-10-CM | POA: Diagnosis present

## 2015-04-19 DIAGNOSIS — S2232XA Fracture of one rib, left side, initial encounter for closed fracture: Secondary | ICD-10-CM | POA: Diagnosis not present

## 2015-04-19 LAB — HEPATIC FUNCTION PANEL
ALT: 69 U/L — ABNORMAL HIGH (ref 17–63)
AST: 129 U/L — ABNORMAL HIGH (ref 15–41)
Albumin: 4.1 g/dL (ref 3.5–5.0)
Alkaline Phosphatase: 119 U/L (ref 38–126)
Bilirubin, Direct: 0.6 mg/dL — ABNORMAL HIGH (ref 0.1–0.5)
Indirect Bilirubin: 1.2 mg/dL — ABNORMAL HIGH (ref 0.3–0.9)
Total Bilirubin: 1.8 mg/dL — ABNORMAL HIGH (ref 0.3–1.2)
Total Protein: 7.1 g/dL (ref 6.5–8.1)

## 2015-04-19 LAB — CBC WITH DIFFERENTIAL/PLATELET
Basophils Absolute: 0.1 10*3/uL (ref 0.0–0.1)
Basophils Relative: 1 %
Eosinophils Absolute: 0.1 10*3/uL (ref 0.0–0.7)
Eosinophils Relative: 1 %
HCT: 38.9 % — ABNORMAL LOW (ref 39.0–52.0)
Hemoglobin: 13.2 g/dL (ref 13.0–17.0)
Lymphocytes Relative: 33 %
Lymphs Abs: 2.6 10*3/uL (ref 0.7–4.0)
MCH: 35.4 pg — ABNORMAL HIGH (ref 26.0–34.0)
MCHC: 33.9 g/dL (ref 30.0–36.0)
MCV: 104.3 fL — ABNORMAL HIGH (ref 78.0–100.0)
Monocytes Absolute: 0.6 10*3/uL (ref 0.1–1.0)
Monocytes Relative: 8 %
Neutro Abs: 4.5 10*3/uL (ref 1.7–7.7)
Neutrophils Relative %: 57 %
Platelets: 161 10*3/uL (ref 150–400)
RBC: 3.73 MIL/uL — ABNORMAL LOW (ref 4.22–5.81)
RDW: 13.5 % (ref 11.5–15.5)
WBC: 7.8 10*3/uL (ref 4.0–10.5)

## 2015-04-19 LAB — BASIC METABOLIC PANEL
Anion gap: 11 (ref 5–15)
BUN: 16 mg/dL (ref 6–20)
CO2: 29 mmol/L (ref 22–32)
Calcium: 9.6 mg/dL (ref 8.9–10.3)
Chloride: 97 mmol/L — ABNORMAL LOW (ref 101–111)
Creatinine, Ser: 1.14 mg/dL (ref 0.61–1.24)
GFR calc Af Amer: >60 mL/min (ref >60)
GFR calc non Af Amer: 58 mL/min — ABNORMAL LOW (ref >60)
Glucose, Bld: 127 mg/dL — ABNORMAL HIGH (ref 65–99)
Potassium: 3 mmol/L — ABNORMAL LOW (ref 3.5–5.1)
Sodium: 137 mmol/L (ref 135–145)

## 2015-04-19 LAB — TROPONIN I: Troponin I: <0.03 ng/mL (ref <0.031)

## 2015-04-19 LAB — PROTIME-INR
INR: 1.14 (ref 0.00–1.49)
Prothrombin Time: 14.7 seconds (ref 11.6–15.2)

## 2015-04-19 MED ORDER — ACETAMINOPHEN 650 MG RE SUPP: 650.0000 mg | Freq: Four times a day (QID) | RECTAL | Status: DC | PRN

## 2015-04-19 MED ORDER — SODIUM CHLORIDE 0.9 % IJ SOLN
3.0000 mL | Freq: Two times a day (BID) | INTRAMUSCULAR | Status: DC
  Administered 2015-04-19 – 2015-04-20 (×3): 3 mL via INTRAVENOUS

## 2015-04-19 MED ORDER — ACETAMINOPHEN 325 MG PO TABS: 650.0000 mg | ORAL_TABLET | Freq: Four times a day (QID) | ORAL | Status: DC | PRN

## 2015-04-19 MED ORDER — PANTOPRAZOLE SODIUM 40 MG PO TBEC
80.0000 mg | DELAYED_RELEASE_TABLET | Freq: Every day | ORAL | Status: DC
  Administered 2015-04-20 – 2015-04-21 (×2): 80 mg via ORAL
  Filled 2015-04-19 (×2): qty 2

## 2015-04-19 MED ORDER — FOLIC ACID 1 MG PO TABS
1.0000 mg | ORAL_TABLET | Freq: Every day | ORAL | Status: DC
  Administered 2015-04-19 – 2015-04-21 (×3): 1 mg via ORAL
  Filled 2015-04-19 (×3): qty 1

## 2015-04-19 MED ORDER — MORPHINE SULFATE (PF) 4 MG/ML IV SOLN: 4.0000 mg | Freq: Once | INTRAVENOUS | Status: DC

## 2015-04-19 MED ORDER — LORAZEPAM 1 MG PO TABS: 1.0000 mg | ORAL_TABLET | Freq: Four times a day (QID) | ORAL | Status: DC | PRN

## 2015-04-19 MED ORDER — OXYCODONE HCL 5 MG PO TABS: 5.0000 mg | ORAL_TABLET | ORAL | Status: DC | PRN

## 2015-04-19 MED ORDER — VITAMIN B-1 100 MG PO TABS
100.0000 mg | ORAL_TABLET | Freq: Every day | ORAL | Status: DC
  Administered 2015-04-19 – 2015-04-21 (×3): 100 mg via ORAL
  Filled 2015-04-19 (×3): qty 1

## 2015-04-19 MED ORDER — POTASSIUM CHLORIDE CRYS ER 20 MEQ PO TBCR
60.0000 meq | EXTENDED_RELEASE_TABLET | Freq: Once | ORAL | Status: AC
  Administered 2015-04-19: 60 meq via ORAL
  Filled 2015-04-19: qty 3

## 2015-04-19 MED ORDER — THIAMINE HCL 100 MG/ML IJ SOLN: 100.0000 mg | Freq: Every day | INTRAMUSCULAR | Status: DC

## 2015-04-19 MED ORDER — ONDANSETRON HCL 4 MG PO TABS: 4.0000 mg | ORAL_TABLET | Freq: Four times a day (QID) | ORAL | Status: DC | PRN

## 2015-04-19 MED ORDER — INFLUENZA VAC SPLIT QUAD 0.5 ML IM SUSY
0.5000 mL | PREFILLED_SYRINGE | INTRAMUSCULAR | Status: AC
  Administered 2015-04-20: 0.5 mL via INTRAMUSCULAR
  Filled 2015-04-19: qty 0.5

## 2015-04-19 MED ORDER — PRAVASTATIN SODIUM 40 MG PO TABS
40.0000 mg | ORAL_TABLET | Freq: Every day | ORAL | Status: DC
  Administered 2015-04-19 – 2015-04-20 (×2): 40 mg via ORAL
  Filled 2015-04-19 (×2): qty 1

## 2015-04-19 MED ORDER — ONDANSETRON HCL 4 MG/2ML IJ SOLN: 4.0000 mg | Freq: Four times a day (QID) | INTRAMUSCULAR | Status: DC | PRN

## 2015-04-19 MED ORDER — RIVAROXABAN 20 MG PO TABS
20.0000 mg | ORAL_TABLET | Freq: Every day | ORAL | Status: DC
  Administered 2015-04-19 – 2015-04-20 (×2): 20 mg via ORAL
  Filled 2015-04-19 (×2): qty 1

## 2015-04-19 MED ORDER — ADULT MULTIVITAMIN W/MINERALS CH
1.0000 | ORAL_TABLET | Freq: Every day | ORAL | Status: DC
  Administered 2015-04-19 – 2015-04-21 (×3): 1 via ORAL
  Filled 2015-04-19 (×3): qty 1

## 2015-04-19 MED ORDER — LORAZEPAM 2 MG/ML IJ SOLN: 1.0000 mg | Freq: Four times a day (QID) | INTRAMUSCULAR | Status: DC | PRN

## 2015-04-19 NOTE — ED Notes (Signed)
Pt son called in to ask about the pt. Same was informed that we could not discuss any pt information over the phone. Also states the pt recently advised him that he has stopped taking all of his medications including his meds for a-fib. Son(Dave Bannockburn) [phone]
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
## 2015-04-19 NOTE — ED Notes (Addendum)
Pt's brother and niece arrived. Spoke with same about pt condition. She also states the pt has stopped taking his medications and drinks heavily. States he has been seen on multiple occasions for same and for falls related to etoh use. Niece also verbalizes concerns that he may soon go into withdrawals from the alcohol if he does not continue to drink daily.
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
## 2015-04-20 ENCOUNTER — Observation Stay (HOSPITAL_BASED_OUTPATIENT_CLINIC_OR_DEPARTMENT_OTHER): Payer: Medicare Other

## 2015-04-20 DIAGNOSIS — I10 Essential (primary) hypertension: Secondary | ICD-10-CM | POA: Diagnosis not present

## 2015-04-20 DIAGNOSIS — R55 Syncope and collapse: Secondary | ICD-10-CM | POA: Diagnosis not present

## 2015-04-20 DIAGNOSIS — I951 Orthostatic hypotension: Secondary | ICD-10-CM | POA: Diagnosis not present

## 2015-04-20 DIAGNOSIS — Z86711 Personal history of pulmonary embolism: Secondary | ICD-10-CM

## 2015-04-20 DIAGNOSIS — E876 Hypokalemia: Secondary | ICD-10-CM

## 2015-04-20 DIAGNOSIS — S2232XA Fracture of one rib, left side, initial encounter for closed fracture: Secondary | ICD-10-CM | POA: Diagnosis not present

## 2015-04-20 LAB — CBC
HEMATOCRIT: 38.3 % — AB (ref 39.0–52.0)
HEMOGLOBIN: 12.8 g/dL — AB (ref 13.0–17.0)
MCH: 35.5 pg — AB (ref 26.0–34.0)
MCHC: 33.4 g/dL (ref 30.0–36.0)
MCV: 106.1 fL — AB (ref 78.0–100.0)
PLATELETS: 178 10*3/uL (ref 150–400)
RBC: 3.61 MIL/uL — AB (ref 4.22–5.81)
RDW: 13.5 % (ref 11.5–15.5)
WBC: 6.6 10*3/uL (ref 4.0–10.5)

## 2015-04-20 LAB — BASIC METABOLIC PANEL
ANION GAP: 9 (ref 5–15)
BUN: 16 mg/dL (ref 6–20)
CHLORIDE: 101 mmol/L (ref 101–111)
CO2: 29 mmol/L (ref 22–32)
Calcium: 9.4 mg/dL (ref 8.9–10.3)
Creatinine, Ser: 0.81 mg/dL (ref 0.61–1.24)
GFR calc non Af Amer: >60 mL/min (ref >60)
GLUCOSE: 100 mg/dL — AB (ref 65–99)
POTASSIUM: 3.9 mmol/L (ref 3.5–5.1)
Sodium: 139 mmol/L (ref 135–145)

## 2015-04-20 LAB — HEPATIC FUNCTION PANEL
ALBUMIN: 3.5 g/dL (ref 3.5–5.0)
ALT: 58 U/L (ref 17–63)
AST: 96 U/L — AB (ref 15–41)
Alkaline Phosphatase: 113 U/L (ref 38–126)
BILIRUBIN DIRECT: 0.5 mg/dL (ref 0.1–0.5)
Indirect Bilirubin: 1 mg/dL — ABNORMAL HIGH (ref 0.3–0.9)
TOTAL PROTEIN: 6.5 g/dL (ref 6.5–8.1)
Total Bilirubin: 1.5 mg/dL — ABNORMAL HIGH (ref 0.3–1.2)

## 2015-04-20 LAB — MAGNESIUM: MAGNESIUM: 1.4 mg/dL — AB (ref 1.7–2.4)

## 2015-04-20 LAB — VITAMIN B12: Vitamin B-12: 1307 pg/mL — ABNORMAL HIGH (ref 180–914)

## 2015-04-20 LAB — TSH: TSH: 1.439 u[IU]/mL (ref 0.350–4.500)

## 2015-04-20 MED ORDER — POTASSIUM CHLORIDE CRYS ER 20 MEQ PO TBCR
40.0000 meq | EXTENDED_RELEASE_TABLET | Freq: Once | ORAL | Status: AC
  Administered 2015-04-20: 40 meq via ORAL
  Filled 2015-04-20: qty 2

## 2015-04-20 MED ORDER — MAGNESIUM SULFATE 2 GM/50ML IV SOLN
2.0000 g | Freq: Once | INTRAVENOUS | Status: AC
  Administered 2015-04-20: 2 g via INTRAVENOUS
  Filled 2015-04-20: qty 50

## 2015-04-20 MED ORDER — SODIUM CHLORIDE 0.9 % IV SOLN
INTRAVENOUS | Status: DC | PRN
  Administered 2015-04-20: 250 mL via INTRAVENOUS

## 2015-04-20 MED ORDER — SODIUM CHLORIDE 0.9 % IV SOLN
INTRAVENOUS | Status: DC
  Administered 2015-04-20 (×2): via INTRAVENOUS

## 2015-04-20 NOTE — Care Management Note (Signed)
Case Management Note  Patient Details  Name: Jonathon Conway MRN: [id number] Date of Birth: 1932-05-29  Subjective/Objective:                  Pt from home, lives alone and has PD aid that helps 4 hr a day. Pt uses a cane, has no other DME. Pt ind with ADL's.  Action/Plan: Pt plans to return home with self care. No CM needs anticipated.   Expected Discharge Date:   04/21/2015               Expected Discharge Plan:  Home/Self Care  In-House Referral:  NA  Discharge planning Services  CM Consult  Post Acute Care Choice:  NA Choice offered to:  NA  DME Arranged:    DME Agency:     HH Arranged:    HH Agency:     Status of Service:  Completed, signed off  Medicare Important Message Given:    Date Medicare IM Given:    Medicare IM give by:    Date Additional Medicare IM Given:    Additional Medicare Important Message give by:     If discussed at [place] of Stay Meetings, dates discussed:    Additional Comments:  Sherald Barge, RN 04/20/2015, 4:31 PM

## 2015-04-20 NOTE — Consult Note (Signed)
CARDIOLOGY CONSULT NOTE   Patient ID: Jonathon Conway MRN: [id number] DOB/AGE: Jul 29, 1931 81 y.o.  Admit Date: 04/19/2015 Referring Physician: [place] Primary Physician: Sherrie Mustache, MD Consulting Cardiologist: Carlyle Dolly MD Primary Cardiologist: Rozann Lesches MD Reason for Consultation: Syncope  Clinical Summary Jonathon Conway is a 81 y.o.male with known history of PE on Xarelto until one month ago., mulitvessel CAD, hx of bronchogenic carcinoma with open resection of of lung nodule in August of 2015, history of syncope. Presented to the ER with complaints of generalized weakness and 3 episodes of syncope. He was seen in the ER after being found in his car intoxicated and passed out on 03/25/2015. He denies this.   He presented to ER after calling PCP with complaints of weakness, fatigue, and chest pain. On further questioning, the chest pain occurred when he turned onto his left side. He was found to have left sided rib fractures and hypokalemia. BP 113/67, HR 95, O2 Sat 96%, afebrile. He was not found to be anemic. Potassium 3.0, Mg 1.4. Glucose 127.   LFTs are elevated. No evidence of acute intracranial abnormality. Mild atrophy and chronic small-vessel matter ischemic changes. He was given potassium replacement. EKG demonstrated ST with RBBB.   He states he has passed out 3 times for unknown reasons. He passed out walking in his yard about a month ago, passed out while driving a few weeks ago, and then recently while standing in his bathroom but does nto remember anything until he awoke in his recliner.  He does drink daily and minimizes consumption. Vodka :23 drinks a day-makes me feel better." He admits to have a period of depression and stopped taking his medications or making follow up appts with providers.    Allergies  Allergen Reactions  . Asa [Aspirin] Anaphylaxis    Medications Scheduled Medications: . folic acid  1 mg Oral Daily  . Influenza vac  split quadrivalent PF  0.5 mL Intramuscular Tomorrow-1000  . multivitamin with minerals  1 tablet Oral Daily  . pantoprazole  80 mg Oral Daily  . pravastatin  40 mg Oral q1800  . rivaroxaban  20 mg Oral q1800  . sodium chloride  3 mL Intravenous Q12H  . thiamine  100 mg Oral Daily   Or  . thiamine  100 mg Intravenous Daily      PRN Medications: acetaminophen **OR** acetaminophen, LORazepam **OR** LORazepam, ondansetron **OR** ondansetron (ZOFRAN) IV, oxyCODONE   Past Medical History  Diagnosis Date  . Essential hypertension, benign   . Hypercholesteremia   . Depression   . GERD (gastroesophageal reflux disease)   . Gout   . Prostate cancer [facility])     Status post radioactive seed implantation  . History of pulmonary embolism   . Coronary atherosclerosis     Documented by chest CT and PET imaging  . Lung nodule     Open resection at [facility] August 2015 - well-differentiated adenocarcinoma     Past Surgical History  Procedure Laterality Date  . Fracture surgery    . Hemorrhoid surgery    . Hernia repair    . Prostate surgery      Prostate seed implantation  . Left elbow surgery    . Tonsillectomy    . Pulmonary nodule resection      Family History  Problem Relation Age of Onset  . Arrhythmia Mother     Pacemaker  . CAD Brother     CABG  . Leukemia Father  Social History Jonathon Conway reports that he has been smoking Cigarettes.  He started smoking about 64 years ago. He has a 60 pack-year smoking history. He does not have any smokeless tobacco history on file. Jonathon Conway reports that he drinks alcohol.  Review of Systems Complete review of systems are found to be negative unless outlined in H&P above.  Physical Examination Blood pressure 132/70, pulse 80, temperature 98.2 F (36.8 C), temperature source Oral, resp. rate 16, height 5\' 10"  (1.778 m), weight 152 lb 1.6 oz (68.992 kg), SpO2 97 %. No intake or output data in the 24 hours ending 04/20/15  1026  Telemetry: SR with PAC's in bigeminy.   GEN: No acute distress. HEENT: Conjunctiva and lids normal, oropharynx clear with moist mucosa. Neck: Supple, no elevated JVP or carotid bruits, no thyromegaly. Lungs: Inspiratory wheezes, with some crackles in the bases.  Cardiac: Irregular rate and rhythm, no S3 soft systolic murmur, no pericardial rub. Abdomen: Soft, nontender, no hepatomegaly, bowel sounds present, no guarding or rebound. Extremities: No pitting edema, distal pulses 2+. Skin: Warm and dry. Musculoskeletal: No kyphosis. Pain with palpation of the left chest.  Neuropsychiatric: Alert and oriented x3, affect grossly appropriate.  Prior Cardiac Testing/Procedures 1. NM Stress test 01/14/2013 low risk abnormal exercise Cardiolite in August 2014 indicating possible scar in the inferior and inferior septal distribution with normal LVEF, no active ischemia.  Echo pending.   Lab Results  Basic Metabolic Panel:  Recent Labs Lab 04/19/15 1526 04/20/15 0719  NA 137 139  K 3.0* 3.9  CL 97* 101  CO2 29 29  GLUCOSE 127* 100*  BUN 16 16  CREATININE 1.14 0.81  CALCIUM 9.6 9.4  MG  --  1.4*    Liver Function Tests:  Recent Labs Lab 04/19/15 1526 04/20/15 0719  AST 129* 96*  ALT 69* 58  ALKPHOS 119 113  BILITOT 1.8* 1.5*  PROT 7.1 6.5  ALBUMIN 4.1 3.5    CBC:  Recent Labs Lab 04/19/15 1526 04/20/15 0719  WBC 7.8 6.6  NEUTROABS 4.5  --   HGB 13.2 12.8*  HCT 38.9* 38.3*  MCV 104.3* 106.1*  PLT 161 178    Cardiac Enzymes:  Recent Labs Lab 04/19/15 1526  TROPONINI <0.03     Radiology: Dg Ribs Unilateral W/chest Left  04/19/2015  CLINICAL DATA:  Left rib pain, fall in the bathroom three days ago EXAM: LEFT RIBS AND CHEST - 3+ VIEW COMPARISON:  03/02/2015 FINDINGS: Four views left ribs submitted. Cardiomediastinal silhouette is stable. No acute infiltrate or pulmonary edema. Study is markedly limited by diffuse osteopenia. There is nondisplaced  fracture of the left fifth and sixth rib. No pneumothorax. IMPRESSION: Nondisplaced fracture of the left fifth and sixth rib anteriorly. Best seen on oblique view. No pneumothorax. Electronically Signed   By: Lahoma Crocker M.D.   On: 04/19/2015 15:32   Ct Head Wo Contrast  04/19/2015  CLINICAL DATA:  81 year old male syncope fall and head injury 3 days ago. Headache. EXAM: CT HEAD WITHOUT CONTRAST TECHNIQUE: Contiguous axial images were obtained from the base of the skull through the vertex without intravenous contrast. COMPARISON:  None. FINDINGS: Atrophy and mild chronic small-vessel white matter ischemic changes are noted. No acute intracranial abnormalities are identified, including mass lesion or mass effect, hydrocephalus, extra-axial fluid collection, midline shift, hemorrhage, or acute infarction. The visualized bony calvarium is unremarkable. IMPRESSION: No evidence of acute intracranial abnormality. Mild atrophy and chronic small-vessel matter ischemic changes. Electronically Signed  By: Margarette Canada M.D.   On: 04/19/2015 18:16     ECG: Sinus tachycardia with frequent PAC's.    Impression and Recommendations  1. Chest Pain: Likely from left rib fractures. He states he feels it when he turns on to his left side and when he coughs or breaths hard.No acute EKG changes or troponin elevation arguing against ACS. He is not on rate control medications at home. Echo pending. CVRF: Male, Age, Smoking hx,   2.Syncopal Episodes: significantly orthostatic by vitals. This is likely etiolgy, likely hypovolemic due to significant EtOH intake and poor oral hydration. Follow symptoms with improved hydration, may need consideration as outpatient for monitor if symptoms persist.   3. Hypomagnesemia: 1.4 in ER. Will provide IV magnesium X 1. Related to [facility] use.        Signed: Phill Myron. [place] NP [place]  04/20/2015, 10:26 AM Co-Sign MD   Patient seen and discussed with NP Purcell Nails, I agree with her  documentation. 81 yo male history of PE on xarelto, CAD, hx of lung CA, EtOH abuse,HTN, HL admitted with syncope.  Trop neg, K 3, CR 1.14 (baseline 0.5), Hgb 13.2, Plt 161, AST 96, ALT 58, T.bili 1.5, Mg 1.4 CXR rib fracture CT head no acute process Echo pending EKG SR, bifascicular block (RBBB,LAFB) rate 96  ER vitals: bp 113/67 p 95 99% RA   Patient admitted with syncope. Severely orthostatic by bp with 70 point SBP drop with standing per nursing documentation. He is likely chronically hypovolemic related to his alcohol use. He drinks 2-4 vodka/7ups a day along with occasional coffee, overall poor hydration/oral intake. He reports frequent dizziness with standing Cr 1.14-->0.81 with IVF.   There is mention of low heart rates in the 30s in the ER however by ekg and telemetry review I do not see where this was captured by EKG or tele. Tele on floor shows SR with frequent PACs and runs of SVT. We will replace his Mg, keep K at 4 and follow tele. Given runs of SVT and evidence of conduction disease there is some concern about tachy-brady syndrome. Follow tele in hospital, if symptoms do not resolve with improved hydration will need outpatient monitor arranged at f/u. I have written for 2g of Mg. Will f/u echo results. With PSVT check TSH.    Zandra Abts MD

## 2015-04-20 NOTE — Progress Notes (Signed)
[facility] PROGRESS NOTE  SHAHAN STARKS HWK:[id number] DOB: 1932/02/27 DOA: 04/19/2015 PCP: Sherrie Mustache, MD  Assessment/Plan: 1. Syncope, likely due to orthostatic hypotension from dehydration/poor oral intake. Severely orthostatic with drop of 70 points in SBP with standing. Head CT unremarkable, ECHO pending. Cardiology input appreciated. Will continue IVF.  2. Hypokalemia, repleted. 3. Hypomagnesemia, will replete.  4. Alcohol abuse without evidence of withdrawal. Likely contributing to dehydration. Continue CIWA protocol. Counseled on the importance of alcohol cessation.  5. Rib fx secondary to fall. Continue pain management and supportive treatment.  6. Transaminitis, likely from alcohol abuse. 7. Hx of PE. Was on Xarelto x2 years but recently stopped taking all of his oral medications. Will resume.  8. Essential HTN, stable. Continue home meds.   Code Status:Full DVT prophylaxis: Xarelto Family Communication: Discussed with patient who understands and has no concerns at this time. Disposition Plan: Anticipate discharge tomorrow.    Consultants:   Cardiology- Arnoldo Lenis, MD  Procedures:    Antibiotics:    HPI/Subjective: Feels okay. States he is only dizzy upon standing. Expresses interest in quitting alcohol use.   Objective: Filed Vitals:   04/20/15 0611  BP: 132/70  Pulse: 80  Temp: 98.2 F (36.8 C)  Resp: 16   No intake or output data in the 24 hours ending 04/20/15 0754 Filed Weights   04/19/15 1435 04/19/15 2058  Weight: 72.576 kg (160 lb) 68.992 kg (152 lb 1.6 oz)    Exam:  General: NAD, looks comfortable Cardiovascular: RRR, S1, S2  Respiratory: clear bilaterally, No wheezing, rales or rhonchi Abdomen: soft, non tender, no distention , bowel sounds normal Musculoskeletal: RLE is more edematous than left which is chronic  Data Reviewed: Basic Metabolic Panel:  Recent Labs Lab 04/19/15 1526 04/20/15 0719  NA 137  139  K 3.0* 3.9  CL 97* 101  CO2 29 29  GLUCOSE 127* 100*  BUN 16 16  CREATININE 1.14 0.81  CALCIUM 9.6 9.4  MG  --  1.4*   Liver Function Tests:  Recent Labs Lab 04/19/15 1526 04/20/15 0719  AST 129* 96*  ALT 69* 58  ALKPHOS 119 113  BILITOT 1.8* 1.5*  PROT 7.1 6.5  ALBUMIN 4.1 3.5    CBC:  Recent Labs Lab 04/19/15 1526 04/20/15 0719  WBC 7.8 6.6  NEUTROABS 4.5  --   HGB 13.2 12.8*  HCT 38.9* 38.3*  MCV 104.3* 106.1*  PLT 161 178   Cardiac Enzymes:  Recent Labs Lab 04/19/15 1526  TROPONINI <0.03   BNP (last 3 results)  Recent Labs  03/02/15 1255  BNP 106.0*       Studies: Dg Ribs Unilateral W/chest Left  04/19/2015  CLINICAL DATA:  Left rib pain, fall in the bathroom three days ago EXAM: LEFT RIBS AND CHEST - 3+ VIEW COMPARISON:  03/02/2015 FINDINGS: Four views left ribs submitted. Cardiomediastinal silhouette is stable. No acute infiltrate or pulmonary edema. Study is markedly limited by diffuse osteopenia. There is nondisplaced fracture of the left fifth and sixth rib. No pneumothorax. IMPRESSION: Nondisplaced fracture of the left fifth and sixth rib anteriorly. Best seen on oblique view. No pneumothorax. Electronically Signed   By: Lahoma Crocker M.D.   On: 04/19/2015 15:32   Ct Head Wo Contrast  04/19/2015  CLINICAL DATA:  81 year old male syncope fall and head injury 3 days ago. Headache. EXAM: CT HEAD WITHOUT CONTRAST TECHNIQUE: Contiguous axial images were obtained from the base of the skull through the vertex without intravenous  contrast. COMPARISON:  None. FINDINGS: Atrophy and mild chronic small-vessel white matter ischemic changes are noted. No acute intracranial abnormalities are identified, including mass lesion or mass effect, hydrocephalus, extra-axial fluid collection, midline shift, hemorrhage, or acute infarction. The visualized bony calvarium is unremarkable. IMPRESSION: No evidence of acute intracranial abnormality. Mild atrophy and chronic  small-vessel matter ischemic changes. Electronically Signed   By: Margarette Canada M.D.   On: 04/19/2015 18:16    Scheduled Meds: . folic acid  1 mg Oral Daily  . Influenza vac split quadrivalent PF  0.5 mL Intramuscular Tomorrow-1000  . multivitamin with minerals  1 tablet Oral Daily  . pantoprazole  80 mg Oral Daily  . pravastatin  40 mg Oral q1800  . rivaroxaban  20 mg Oral q1800  . sodium chloride  3 mL Intravenous Q12H  . thiamine  100 mg Oral Daily   Or  . thiamine  100 mg Intravenous Daily   Continuous Infusions:   Principal Problem:   Syncope and collapse Active Problems:   Essential hypertension, benign   Coronary atherosclerosis   Hx pulmonary embolism   Hypokalemia   Rib fractures   Syncope    Time spent: 20 minutes  Gwenda Heiner. MD  [facility] Pager [phone]. If 7PM-7AM, please contact night-coverage at [email or web address], password [facility] 04/20/2015, 7:54 AM  LOS: 1 day     By signing my name below, I, Rosalie Doctor, attest that this documentation has been prepared under the direction and in the presence of [facility]. MD Electronically Signed: Rosalie Doctor, Scribe. 04/20/2015 12:00pm  I, Dr. Kathie Dike, personally performed the services described in this documentaiton. All medical record entries made by the scribe were at my direction and in my presence. I have reviewed the chart and agree that the record reflects my personal performance and is accurate and complete  Kathie Dike, MD, 04/20/2015 12:16 PM

## 2015-04-20 NOTE — Progress Notes (Signed)
Patient/Family oriented to room. Information packet given to patient/family. Admission inpatient armband information verified with patient/family to include name and date of birth and placed on patient arm. Side rails up x 2, fall assessment and education completed with patient/family. Call light within reach. Patient/family able to voice and demonstrate understanding of unit orientation instructions  

## 2015-04-20 NOTE — Care Management Obs Status (Signed)
[facility] NOTIFICATION   Patient Details  Name: Jonathon Conway MRN: [id number] Date of Birth: 1931-07-23   Medicare Observation Status Notification Given:  Yes    Sherald Barge, RN 04/20/2015, 4:30 PM

## 2015-04-21 DIAGNOSIS — Z23 Encounter for immunization: Secondary | ICD-10-CM | POA: Diagnosis not present

## 2015-04-21 DIAGNOSIS — I471 Supraventricular tachycardia: Secondary | ICD-10-CM | POA: Diagnosis present

## 2015-04-21 DIAGNOSIS — I10 Essential (primary) hypertension: Secondary | ICD-10-CM | POA: Diagnosis present

## 2015-04-21 DIAGNOSIS — Z86711 Personal history of pulmonary embolism: Secondary | ICD-10-CM | POA: Diagnosis not present

## 2015-04-21 DIAGNOSIS — I951 Orthostatic hypotension: Secondary | ICD-10-CM | POA: Diagnosis present

## 2015-04-21 DIAGNOSIS — Z85118 Personal history of other malignant neoplasm of bronchus and lung: Secondary | ICD-10-CM | POA: Diagnosis not present

## 2015-04-21 DIAGNOSIS — S2242XA Multiple fractures of ribs, left side, initial encounter for closed fracture: Secondary | ICD-10-CM | POA: Diagnosis present

## 2015-04-21 DIAGNOSIS — E7800 Pure hypercholesterolemia, unspecified: Secondary | ICD-10-CM | POA: Diagnosis present

## 2015-04-21 DIAGNOSIS — S2232XA Fracture of one rib, left side, initial encounter for closed fracture: Secondary | ICD-10-CM | POA: Diagnosis not present

## 2015-04-21 DIAGNOSIS — E860 Dehydration: Secondary | ICD-10-CM | POA: Diagnosis present

## 2015-04-21 DIAGNOSIS — Y92012 Bathroom of single-family (private) house as the place of occurrence of the external cause: Secondary | ICD-10-CM | POA: Diagnosis not present

## 2015-04-21 DIAGNOSIS — R55 Syncope and collapse: Secondary | ICD-10-CM | POA: Diagnosis present

## 2015-04-21 DIAGNOSIS — Z7901 Long term (current) use of anticoagulants: Secondary | ICD-10-CM | POA: Diagnosis not present

## 2015-04-21 DIAGNOSIS — D531 Other megaloblastic anemias, not elsewhere classified: Secondary | ICD-10-CM | POA: Diagnosis present

## 2015-04-21 DIAGNOSIS — E876 Hypokalemia: Secondary | ICD-10-CM | POA: Diagnosis present

## 2015-04-21 DIAGNOSIS — Z8546 Personal history of malignant neoplasm of prostate: Secondary | ICD-10-CM | POA: Diagnosis not present

## 2015-04-21 DIAGNOSIS — F1010 Alcohol abuse, uncomplicated: Secondary | ICD-10-CM | POA: Diagnosis present

## 2015-04-21 DIAGNOSIS — F17210 Nicotine dependence, cigarettes, uncomplicated: Secondary | ICD-10-CM | POA: Diagnosis present

## 2015-04-21 DIAGNOSIS — K219 Gastro-esophageal reflux disease without esophagitis: Secondary | ICD-10-CM | POA: Diagnosis present

## 2015-04-21 DIAGNOSIS — W1830XA Fall on same level, unspecified, initial encounter: Secondary | ICD-10-CM | POA: Diagnosis present

## 2015-04-21 DIAGNOSIS — I2510 Atherosclerotic heart disease of native coronary artery without angina pectoris: Secondary | ICD-10-CM | POA: Diagnosis present

## 2015-04-21 MED ORDER — VITAMIN D 1000 UNITS PO TABS: 1000.0000 [IU] | ORAL_TABLET | Freq: Every morning | ORAL | Status: AC

## 2015-04-21 MED ORDER — ESCITALOPRAM OXALATE 10 MG PO TABS: 10.0000 mg | ORAL_TABLET | Freq: Every morning | ORAL | Status: AC

## 2015-04-21 MED ORDER — RIVAROXABAN 20 MG PO TABS: 20.0000 mg | ORAL_TABLET | Freq: Every day | ORAL | Status: AC

## 2015-04-21 MED ORDER — ESOMEPRAZOLE MAGNESIUM 40 MG PO CPDR: 40.0000 mg | DELAYED_RELEASE_CAPSULE | Freq: Every morning | ORAL | Status: DC

## 2015-04-21 MED ORDER — PRAVASTATIN SODIUM 40 MG PO TABS
40.0000 mg | ORAL_TABLET | Freq: Every morning | ORAL | Status: AC
Start: 2015-04-21 — End: ?

## 2015-04-21 MED ORDER — ALBUTEROL SULFATE HFA 108 (90 BASE) MCG/ACT IN AERS
2.0000 | INHALATION_SPRAY | RESPIRATORY_TRACT | Status: AC | PRN
Start: 2015-04-21 — End: ?

## 2015-04-21 NOTE — Progress Notes (Signed)
Patient states understanding of discharge instructions.  
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
## 2015-04-23 LAB — FOLATE RBC
FOLATE, HEMOLYSATE: 380.6 ng/mL
FOLATE, RBC: 1020 ng/mL (ref >498)
Hematocrit: 37.3 % — ABNORMAL LOW (ref 37.5–51.0)

## 2015-05-14 ENCOUNTER — Ambulatory Visit (INDEPENDENT_AMBULATORY_CARE_PROVIDER_SITE_OTHER): Payer: Medicare Other

## 2015-05-14 ENCOUNTER — Encounter: Payer: Self-pay | Admitting: Cardiology

## 2015-05-14 ENCOUNTER — Ambulatory Visit (INDEPENDENT_AMBULATORY_CARE_PROVIDER_SITE_OTHER): Payer: Medicare Other | Admitting: Cardiology

## 2015-05-14 VITALS — BP 168/70 | HR 92 | Ht 70.0 in | Wt 164.0 lb

## 2015-05-14 DIAGNOSIS — R55 Syncope and collapse: Secondary | ICD-10-CM

## 2015-05-14 DIAGNOSIS — I10 Essential (primary) hypertension: Secondary | ICD-10-CM

## 2015-05-14 DIAGNOSIS — I2510 Atherosclerotic heart disease of native coronary artery without angina pectoris: Secondary | ICD-10-CM | POA: Diagnosis not present

## 2015-05-14 DIAGNOSIS — I471 Supraventricular tachycardia: Secondary | ICD-10-CM | POA: Diagnosis not present
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
## 2015-05-14 NOTE — Patient Instructions (Signed)
Your physician recommends that you schedule a follow-up appointment in: after event monitor in 30 days   Your physician recommends that you continue on your current medications as directed. Please refer to the Current Medication list given to you today.    If you need a refill on your cardiac medications before your next appointment, please call your pharmacy.    Your physician has recommended that you wear an event monitor. Event monitors are medical devices that record the heart's electrical activity. Doctors most often [place] these monitors to diagnose arrhythmias. Arrhythmias are problems with the speed or rhythm of the heartbeat. The monitor is a small, portable device. You can wear one while you do your normal daily activities. This is usually used to diagnose what is causing palpitations/syncope (passing out).    Thank you for choosing [facility] !

## 2015-05-29 ENCOUNTER — Telehealth: Payer: Self-pay | Admitting: Cardiology

## 2015-05-29 NOTE — Telephone Encounter (Signed)
Pt states his Event Monitor is not charging

## 2015-05-29 NOTE — Telephone Encounter (Signed)
Pt will call coming anf they will mail him new charger

## 2015-06-04 ENCOUNTER — Telehealth: Payer: Self-pay | Admitting: Adult Health

## 2015-06-04 NOTE — Telephone Encounter (Signed)
If he can, would have him make up for the 5 days that he lost.

## 2015-06-04 NOTE — Telephone Encounter (Signed)
Pt stated that he had a problem with his monitor and he got a new one. He stated that he will hook it up tomorrow, but he has lost 5 days due to the problems it was having. He wanted to know do you want him to wear it a few extra days  than originally planned or send it back on the original date of 06/11/15?? Please advise.

## 2015-06-04 NOTE — Telephone Encounter (Signed)
Patient is having to get new monitor. Has questions on length of time he needs to wear it now. / tg

## 2015-06-05 NOTE — Telephone Encounter (Signed)
Pt will wear an additional 5 days

## 2015-06-15 ENCOUNTER — Telehealth: Payer: Self-pay

## 2015-06-15 DIAGNOSIS — R7989 Other specified abnormal findings of blood chemistry: Secondary | ICD-10-CM

## 2015-06-15 DIAGNOSIS — R945 Abnormal results of liver function studies: Secondary | ICD-10-CM

## 2015-06-15 NOTE — Telephone Encounter (Signed)
-----   Message from Satira Sark, MD sent at 06/15/2015 11:02 AM EST ----- Reviewed. Please see report. He has fairly frequent episodes of PSVT with heart rates up into the 170s to 180s. Although not always associated with syncope, this may well be contributing to some of his symptoms. We may need to start him on amiodarone, but he does have a history of mildly a normal LFTs and alcohol use. Please check LFTs and schedule an office follow-up visit for him within the next few weeks.

## 2015-06-15 NOTE — Telephone Encounter (Signed)
Pt will have lft's done 1 week before fu visit with Dr Domenic Polite on 07/03/15 at 1 pm   Mailed lab slip,pt verbalizes understanding

## 2015-06-28 LAB — HEPATIC FUNCTION PANEL
ALT: 24 U/L (ref 9–46)
AST: 46 U/L — AB (ref 10–35)
Albumin: 4.2 g/dL (ref 3.6–5.1)
Alkaline Phosphatase: 99 U/L (ref 40–115)
BILIRUBIN DIRECT: 0.2 mg/dL (ref <=0.2)
BILIRUBIN INDIRECT: 0.7 mg/dL (ref 0.2–1.2)
Total Bilirubin: 0.9 mg/dL (ref 0.2–1.2)
Total Protein: 7.2 g/dL (ref 6.1–8.1)

## 2015-07-03 ENCOUNTER — Encounter: Payer: Self-pay | Admitting: Cardiology

## 2015-07-03 ENCOUNTER — Ambulatory Visit (INDEPENDENT_AMBULATORY_CARE_PROVIDER_SITE_OTHER): Payer: Medicare Other | Admitting: Cardiology

## 2015-07-03 VITALS — BP 154/88 | HR 67 | Ht 70.0 in | Wt 166.2 lb

## 2015-07-03 DIAGNOSIS — I2510 Atherosclerotic heart disease of native coronary artery without angina pectoris: Secondary | ICD-10-CM | POA: Diagnosis not present

## 2015-07-03 DIAGNOSIS — Z86711 Personal history of pulmonary embolism: Secondary | ICD-10-CM

## 2015-07-03 DIAGNOSIS — Z79899 Other long term (current) drug therapy: Secondary | ICD-10-CM

## 2015-07-03 DIAGNOSIS — I10 Essential (primary) hypertension: Secondary | ICD-10-CM | POA: Diagnosis not present

## 2015-07-03 DIAGNOSIS — I471 Supraventricular tachycardia: Secondary | ICD-10-CM | POA: Diagnosis not present

## 2015-07-03 DIAGNOSIS — E782 Mixed hyperlipidemia: Secondary | ICD-10-CM

## 2015-07-03 MED ORDER — AMIODARONE HCL 200 MG PO TABS: 200.0000 mg | ORAL_TABLET | Freq: Every day | ORAL | Status: DC

## 2015-07-03 NOTE — Patient Instructions (Addendum)
Medication Instructions:  Start AMIODARONE 200 MG DAILY  Labwork:3 MONTHS  TSH  LFT'S   Testing/Procedures: NONE  Follow-Up: Your physician recommends that you schedule a follow-up appointment in: 3 MONTHS WITH DR. MCDOWELL     Any Other Special Instructions Will Be Listed Below (If Applicable).     If you need a refill on your cardiac medications before your next appointment, please call your pharmacy.

## 2015-07-03 NOTE — Progress Notes (Signed)
Cardiology Office Note  Date: 07/03/2015   ID: [name], DOB Dec 08, 1931, MRN [id number]  PCP: Sherrie Mustache, MD  Primary Cardiologist: Rozann Lesches, MD   Chief Complaint  Patient presents with  . Follow-up cardiac monitor    History of Present Illness: Jonathon Conway is an 82 y.o. male last seen in November 2016. He presents for a follow-up visit. Since our last encounter, he does not report having any episodes of syncope. He states that he has occasional feelings of dizziness, but still no frank palpitations. We had him wear a cardiac monitor for further evaluation.  Interval 30 days event recorder showed predominant rhythm to be sinus, however with frequent episodes of PSVT as fast as the 170s to 180s. No pauses were noted. We discussed these results today. He had also had SVT documented while he was in the hospital previously.  Medications are reviewed below. He continues on Norvasc, Pravachol, and Xarelto (history of PE). He does not report any bleeding problems.  Cardiac workup includes documentation of normal LVEF with mild diastolic dysfunction, mild to moderate aortic regurgitation which is likely asymptomatic. He has coronary atherosclerosis based on chest CT imaging, with no clear evidence of recent ACS.  Past Medical History  Diagnosis Date  . Essential hypertension, benign   . Hypercholesteremia   . Depression   . GERD (gastroesophageal reflux disease)   . Gout   . Prostate cancer [facility])     Status post radioactive seed implantation  . History of pulmonary embolism   . Coronary atherosclerosis     Documented by chest CT and PET imaging  . Lung nodule     Open resection at [facility] August 2015 - well-differentiated adenocarcinoma     Past Surgical History  Procedure Laterality Date  . Fracture surgery    . Hemorrhoid surgery    . Hernia repair    . Prostate surgery      Prostate seed implantation  . Left elbow surgery    .  Tonsillectomy    . Pulmonary nodule resection      Current Outpatient Prescriptions  Medication Sig Dispense Refill  . acetaminophen (TYLENOL) 500 MG tablet Take 1,000 mg by mouth every 6 (six) hours as needed for moderate pain.    Marland Kitchen albuterol (PROVENTIL HFA;VENTOLIN HFA) 108 (90 BASE) MCG/ACT inhaler Inhale 2 puffs into the lungs every 4 (four) hours as needed for wheezing or shortness of breath. 1 Inhaler 1  . amLODipine (NORVASC) 5 MG tablet Take 5 mg by mouth.    . cholecalciferol (VITAMIN D) 1000 UNITS tablet Take 1 tablet (1,000 Units total) by mouth every morning. 30 tablet 0  . colchicine (COLCRYS) 0.6 MG tablet TAKE ONE TABLET BY MOUTH TWICE DAILY FOR TEN DAYS    . escitalopram (LEXAPRO) 10 MG tablet Take 1 tablet (10 mg total) by mouth every morning. 30 tablet 0  . esomeprazole (NEXIUM) 40 MG capsule Take 1 capsule (40 mg total) by mouth every morning. 30 capsule 0  . loratadine (CLARITIN REDITABS) 10 MG dissolvable tablet Take 10 mg by mouth daily.    . Multiple Vitamin (MULTIVITAMIN) tablet Take 1 tablet by mouth daily.    . nitroGLYCERIN (NITROSTAT) 0.4 MG SL tablet Place 1 tablet (0.4 mg total) under the tongue every 5 (five) minutes as needed for chest pain. Up to 3 doses. If no relief after 3rd dose, proceed to ED for evaluation. 25 tablet 3  . pravastatin (PRAVACHOL) 40 MG tablet  Take 1 tablet (40 mg total) by mouth every morning. 30 tablet 0  . pyridOXINE (VITAMIN B-6) 100 MG tablet Take 100 mg by mouth every morning.     . rivaroxaban (XARELTO) 20 MG TABS tablet Take 1 tablet (20 mg total) by mouth daily. 30 tablet 0  . triamcinolone (NASACORT ALLERGY 24HR) 55 MCG/ACT AERO nasal inhaler Place 2 sprays into the nose daily.    . vitamin E 400 UNIT capsule Take 400 Units by mouth daily.     No current facility-administered medications for this visit.   Allergies:  Asa   Social History: The patient  reports that he has been smoking Cigarettes.  He started smoking about 64  years ago. He has a 60 pack-year smoking history. He does not have any smokeless tobacco history on file. He reports that he drinks alcohol. He reports that he does not use illicit drugs.   ROS:  Please see the history of present illness. Otherwise, complete review of systems is positive for diminished hearing, chronic mild dyspnea and exertion.  All other systems are reviewed and negative.   Physical Exam: VS:  BP 154/88 mmHg  Pulse 67  Ht 5\' 10"  (1.778 m)  Wt 166 lb 3.2 oz (75.388 kg)  BMI 23.85 kg/m2  SpO2 94%, BMI Body mass index is 23.85 kg/(m^2).  Wt Readings from Last 3 Encounters:  07/03/15 166 lb 3.2 oz (75.388 kg)  05/14/15 164 lb (74.39 kg)  04/19/15 152 lb 1.6 oz (68.992 kg)    General: Elderly male, appears comfortable at rest. HEENT: Conjunctiva and lids normal, oropharynx clear. Neck: Supple, no elevated JVP or carotid bruits, no thyromegaly. Lungs: Diminished breath sounds, nonlabored breathing at rest. Cardiac: Regular rate and rhythm, no S3, 2/6 basal systolic murmur, no pericardial rub. Abdomen: Soft, nontender, bowel sounds present, no guarding or rebound. Extremities: No pitting edema, distal pulses 2+. Skin: Warm and dry. Musculoskeletal: No kyphosis. Neuropsychiatric: Alert and oriented x3, affect grossly appropriate.  ECG: ECG is not ordered today.  Recent Labwork: 03/02/2015: B Natriuretic Peptide 106.0* 04/20/2015: BUN 16; Creatinine, Ser 0.81; Hemoglobin 12.8*; Magnesium 1.4*; Platelets 178; Potassium 3.9; Sodium 139; TSH 1.439 06/27/2015: ALT 24; AST 46*   Other Studies Reviewed Today:  Echocardiogram 04/20/2015: Study Conclusions  - Left ventricle: The cavity size was normal. Wall thickness was increased increased in a pattern of mild to moderate LVH. Systolic function was normal. The estimated ejection fraction was in the range of 55% to 60%. Doppler parameters are consistent with abnormal left ventricular relaxation (grade 1  diastolic dysfunction). - Aortic valve: Moderately calcified annulus. Moderately thickened leaflets. Uncertain number of leaflets. There is AV sclerosis without stenosis. There was mild to moderate regurgitation. Valve area (VTI): 2.63 cm^2. Valve area (Vmax): 2.6 cm^2. Valve area (Vmean): 2.52 cm^2. - Mitral valve: Mildly calcified annulus. Mildly thickened leaflets . - Atrial septum: No defect or patent foramen ovale was identified. - Technically difficult study.  Assessment and Plan:  1. Frequent PSVT based on recent cardiac monitoring. We will plan to initiate low-dose amiodarone in hopes to suppress this rhythm in light of history of dizziness and at times syncope, although this may well be multifactorial. He had recent lab work done including LFTs which showed improvement. We will start amiodarone 200 mg daily and then follow-up with TSH and LFTs in 3 months.  2. Essential hypertension, blood pressure is elevated today. Reports compliance with his medications. I recommended that he keep follow-up with Dr. Edrick Oh.  3.  Coronary atherosclerosis based on chest CT imaging. He has normal LVEF and no active angina symptoms.  4. History of pulmonary embolus, currently on Xarelto.  5. Hyperlipidemia, on Pravachol, followed by Dr. Edrick Oh. I reviewed his last lab work from November 2016. LDL was 70 at that time.  Current medicines were reviewed with the patient today.   Disposition: FU with me in 3 months.   Signed, Satira Sark, MD, [facility] 07/03/2015 1:21 PM    [facility] [address] [place], [place] [address] Phone: [phone]; Fax: [phone]

## 2015-07-05 ENCOUNTER — Other Ambulatory Visit: Payer: Self-pay

## 2015-07-05 ENCOUNTER — Other Ambulatory Visit: Payer: Self-pay | Admitting: Cardiology

## 2015-07-05 MED ORDER — AMIODARONE HCL 200 MG PO TABS: 200.0000 mg | ORAL_TABLET | Freq: Every day | ORAL | Status: DC

## 2015-07-05 NOTE — Telephone Encounter (Signed)
Pt had pharmacy listed as [facility] in [place], [facility] sent escribed refill for #90 with RF:3

## 2015-07-05 NOTE — Telephone Encounter (Signed)
Pt is supposed to be getting his Rx Amoidarone at [facility], but the Rx they have is only written for one day instead of 90 ---telephone number [phone]

## 2015-07-05 NOTE — Telephone Encounter (Signed)
Pharmacy requested 90 day supply,done

## 2015-09-27 ENCOUNTER — Other Ambulatory Visit: Payer: Self-pay

## 2015-09-27 DIAGNOSIS — Z79899 Other long term (current) drug therapy: Secondary | ICD-10-CM

## 2015-09-27 LAB — HEPATIC FUNCTION PANEL
ALT: 39 U/L (ref 9–46)
AST: 74 U/L — ABNORMAL HIGH (ref 10–35)
Albumin: 4.5 g/dL (ref 3.6–5.1)
Alkaline Phosphatase: 100 U/L (ref 40–115)
BILIRUBIN DIRECT: 0.3 mg/dL — AB (ref <=0.2)
BILIRUBIN INDIRECT: 1 mg/dL (ref 0.2–1.2)
BILIRUBIN TOTAL: 1.3 mg/dL — AB (ref 0.2–1.2)
Total Protein: 7.4 g/dL (ref 6.1–8.1)

## 2015-09-27 LAB — TSH: TSH: 1.44 m[IU]/L (ref 0.40–4.50)

## 2015-09-28 ENCOUNTER — Telehealth: Payer: Self-pay | Admitting: *Deleted

## 2015-09-28 NOTE — Telephone Encounter (Signed)
Called patient with test results. No answer. Left message to call back.  

## 2015-09-28 NOTE — Telephone Encounter (Signed)
-----   Message from Satira Sark, MD sent at 09/28/2015  7:16 AM EDT ----- Reviewed. TSH is normal. AST 74 and ALT 39, reasonably stable with some increase in AST but not as high as in the past. Continue current regimen.

## 2015-10-01 ENCOUNTER — Encounter: Payer: Self-pay | Admitting: Cardiology

## 2015-10-01 ENCOUNTER — Ambulatory Visit (INDEPENDENT_AMBULATORY_CARE_PROVIDER_SITE_OTHER): Payer: Medicare Other | Admitting: Cardiology

## 2015-10-01 VITALS — BP 138/82 | HR 75 | Ht 70.0 in | Wt 169.8 lb

## 2015-10-01 DIAGNOSIS — I10 Essential (primary) hypertension: Secondary | ICD-10-CM

## 2015-10-01 DIAGNOSIS — I2510 Atherosclerotic heart disease of native coronary artery without angina pectoris: Secondary | ICD-10-CM

## 2015-10-01 DIAGNOSIS — I480 Paroxysmal atrial fibrillation: Secondary | ICD-10-CM

## 2015-10-01 DIAGNOSIS — Z79899 Other long term (current) drug therapy: Secondary | ICD-10-CM

## 2015-10-01 DIAGNOSIS — E782 Mixed hyperlipidemia: Secondary | ICD-10-CM

## 2015-10-01 NOTE — Progress Notes (Signed)
Cardiology Office Note  Date: 10/01/2015   ID: Jonathon Conway, DOB 1931-10-14, MRN [id number]  PCP: Jonathon Mustache, MD  Primary Cardiologist: Jonathon Lesches, MD   Chief Complaint  Patient presents with  . PSVT  . Coronary Artery Disease    History of Present Illness: Jonathon Conway is an 82 y.o. male last seen in January. He presents for a routine office visit. Reports no dizziness or syncope, no obvious sense of palpitations. He remains functional with his ADLs.  He continues on amiodarone with history of symptomatic PSVT. Most recent follow-up lab work is outlined below. I reviewed his remaining medications as well. He is on Norvasc, Pravachol, and Xarelto. No reported bleeding episodes.  Follow-up ECG today shows sinus rhythm with incomplete right block and left anterior fascicular block. He denies any significant increase in shortness of breath, has had no syncope.  Past Medical History  Diagnosis Date  . Essential hypertension, benign   . Hypercholesteremia   . Depression   . GERD (gastroesophageal reflux disease)   . Gout   . Prostate cancer [facility])     Status post radioactive seed implantation  . History of pulmonary embolism   . Coronary atherosclerosis     Documented by chest CT and PET imaging  . Lung nodule     Open resection at [facility] August 2015 - well-differentiated adenocarcinoma     Past Surgical History  Procedure Laterality Date  . Fracture surgery    . Hemorrhoid surgery    . Hernia repair    . Prostate surgery      Prostate seed implantation  . Left elbow surgery    . Tonsillectomy    . Pulmonary nodule resection      Current Outpatient Prescriptions  Medication Sig Dispense Refill  . acetaminophen (TYLENOL) 500 MG tablet Take 1,000 mg by mouth every 6 (six) hours as needed for moderate pain.    Marland Kitchen albuterol (PROVENTIL HFA;VENTOLIN HFA) 108 (90 BASE) MCG/ACT inhaler Inhale 2 puffs into the lungs every 4 (four) hours as needed  for wheezing or shortness of breath. 1 Inhaler 1  . allopurinol (ZYLOPRIM) 300 MG tablet Take by mouth.    Marland Kitchen amiodarone (PACERONE) 200 MG tablet Take 1 tablet (200 mg total) by mouth daily. 90 tablet 3  . amLODipine (NORVASC) 5 MG tablet Take 5 mg by mouth.    . cholecalciferol (VITAMIN D) 1000 UNITS tablet Take 1 tablet (1,000 Units total) by mouth every morning. 30 tablet 0  . colchicine (COLCRYS) 0.6 MG tablet TAKE ONE TABLET BY MOUTH TWICE DAILY FOR TEN DAYS    . escitalopram (LEXAPRO) 10 MG tablet Take 1 tablet (10 mg total) by mouth every morning. 30 tablet 0  . esomeprazole (NEXIUM) 40 MG capsule Take 1 capsule (40 mg total) by mouth every morning. 30 capsule 0  . loratadine (CLARITIN REDITABS) 10 MG dissolvable tablet Take 10 mg by mouth daily.    . Multiple Vitamin (MULTIVITAMIN) tablet Take 1 tablet by mouth daily.    . nitroGLYCERIN (NITROSTAT) 0.4 MG SL tablet Place 1 tablet (0.4 mg total) under the tongue every 5 (five) minutes as needed for chest pain. Up to 3 doses. If no relief after 3rd dose, proceed to ED for evaluation. 25 tablet 3  . pravastatin (PRAVACHOL) 40 MG tablet Take 1 tablet (40 mg total) by mouth every morning. 30 tablet 0  . pyridOXINE (VITAMIN B-6) 100 MG tablet Take 100 mg by mouth every morning.     Marland Kitchen  rivaroxaban (XARELTO) 20 MG TABS tablet Take 1 tablet (20 mg total) by mouth daily. 30 tablet 0  . triamcinolone (NASACORT ALLERGY 24HR) 55 MCG/ACT AERO nasal inhaler Place 2 sprays into the nose daily.    . vitamin E 400 UNIT capsule Take 400 Units by mouth daily.     No current facility-administered medications for this visit.   Allergies:  Asa   Social History: The patient  reports that he has been smoking Cigarettes.  He started smoking about 64 years ago. He has a 60 pack-year smoking history. He does not have any smokeless tobacco history on file. He reports that he drinks alcohol. He reports that he does not use illicit drugs.   ROS:  Please see the  history of present illness. Otherwise, complete review of systems is positive for decreased hearing.  All other systems are reviewed and negative.   Physical Exam: VS:  BP 138/82 mmHg  Pulse 75  Ht 5\' 10"  (1.778 m)  Wt 169 lb 12.8 oz (77.021 kg)  BMI 24.36 kg/m2  SpO2 97%, BMI Body mass index is 24.36 kg/(m^2).  Wt Readings from Last 3 Encounters:  10/01/15 169 lb 12.8 oz (77.021 kg)  07/03/15 166 lb 3.2 oz (75.388 kg)  05/14/15 164 lb (74.39 kg)    General: Elderly male, appears comfortable at rest. HEENT: Conjunctiva and lids normal, oropharynx clear. Neck: Supple, no elevated JVP or carotid bruits, no thyromegaly. Lungs: Diminished breath sounds, nonlabored breathing at rest. Cardiac: Regular rate and rhythm, no S3, 2/6 basal systolic murmur, no pericardial rub. Abdomen: Soft, nontender, bowel sounds present, no guarding or rebound. Extremities: No pitting edema, distal pulses 2+. Skin: Warm and dry. Musculoskeletal: No kyphosis. Neuropsychiatric: Alert and oriented x3, affect grossly appropriate.  ECG: I personally reviewed the prior tracing from 04/20/2015 which showed sinus tachycardia with bursts of atrial activity, incomplete right bundle branch block, and left anterior second block.  Recent Labwork: 03/02/2015: B Natriuretic Peptide 106.0* 04/20/2015: BUN 16; Creatinine, Ser 0.81; Hemoglobin 12.8*; Magnesium 1.4*; Platelets 178; Potassium 3.9; Sodium 139 09/27/2015: ALT 39; AST 74*; TSH 1.44   Other Studies Reviewed Today:  Echocardiogram 04/20/2015: Study Conclusions  - Left ventricle: The cavity size was normal. Wall thickness was increased increased in a pattern of mild to moderate LVH. Systolic function was normal. The estimated ejection fraction was in the range of 55% to 60%. Doppler parameters are consistent with abnormal left ventricular relaxation (grade 1 diastolic dysfunction). - Aortic valve: Moderately calcified annulus. Moderately  thickened leaflets. Uncertain number of leaflets. There is AV sclerosis without stenosis. There was mild to moderate regurgitation. Valve area (VTI): 2.63 cm^2. Valve area (Vmax): 2.6 cm^2. Valve area (Vmean): 2.52 cm^2. - Mitral valve: Mildly calcified annulus. Mildly thickened leaflets. - Atrial septum: No defect or patent foramen ovale was identified. - Technically difficult study.  Chest x-ray 03/02/2015: FINDINGS: Heart size is normal. Aorta is tortuous. There is mild volume loss on the right. Lungs sutures are identified in the right upper lobe and right mid lung zone. Lungs are otherwise clear. No pulmonary edema. No pleural effusions.  IMPRESSION: No evidence for acute cardiopulmonary abnormality.  Assessment and Plan:  1. Symptomatic PSVT, relatively well controlled at this time on current regimen which includes amiodarone and Xarelto. Follow-up ECG reviewed. We will plan to see him back in 6 months with LFTs and TSH.  2. Essential hypertension, blood pressure is adequately controlled today.  3. Coronary atherosclerosis documented by prior chest CT imaging. No  active angina symptoms. LVEF is normal range by echocardiography.  4. Hyperlipidemia, on Pravachol.  Current medicines were reviewed with the patient today.   Orders Placed This Encounter  Procedures  . EKG 12-Lead    Disposition: FU with me in 6 months.   Signed, Satira Sark, MD, [facility] 10/01/2015 1:36 PM    [facility] at [facility] [address] [place], [place] [address] Phone: [phone]; Fax: [phone]

## 2015-10-01 NOTE — Patient Instructions (Signed)
Your physician recommends that you continue on your current medications as directed. Please refer to the Current Medication list given to you today.  Your physician recommends that you return for lab work in: [place] VISIT TSH LFT'S    Your physician wants you to follow-up in:6 MONTHS WITH DR. MCDOWELL.  You will receive a reminder letter in the mail two months in advance. If you don't receive a letter, please call our office to schedule the follow-up appointment.  Thanks for choosing [facility]!!!

## 2016-03-28 NOTE — Progress Notes (Deleted)
Cardiology Office Note  Date: 03/28/2016   ID: Jonathon Conway, DOB 06-Dec-1931, MRN [id number]  PCP: Sherrie Mustache, MD  Primary Cardiologist: Rozann Lesches, MD   No chief complaint on file.   History of Present Illness: Jonathon Conway is an 82 y.o. male last seen in April.  He is due for follow-up TSH and LFTs.  Past Medical History:  Diagnosis Date  . Coronary atherosclerosis    Documented by chest CT and PET imaging  . Depression   . Essential hypertension, benign   . GERD (gastroesophageal reflux disease)   . Gout   . History of pulmonary embolism   . Hypercholesteremia   . Lung nodule    Open resection at [facility] August 2015 - well-differentiated adenocarcinoma   . Prostate cancer [facility])    Status post radioactive seed implantation    Past Surgical History:  Procedure Laterality Date  . FRACTURE SURGERY    . HEMORRHOID SURGERY    . HERNIA REPAIR    . Left elbow surgery    . PROSTATE SURGERY     Prostate seed implantation  . Pulmonary nodule resection    . TONSILLECTOMY      Current Outpatient Prescriptions  Medication Sig Dispense Refill  . acetaminophen (TYLENOL) 500 MG tablet Take 1,000 mg by mouth every 6 (six) hours as needed for moderate pain.    Marland Kitchen albuterol (PROVENTIL HFA;VENTOLIN HFA) 108 (90 BASE) MCG/ACT inhaler Inhale 2 puffs into the lungs every 4 (four) hours as needed for wheezing or shortness of breath. 1 Inhaler 1  . allopurinol (ZYLOPRIM) 300 MG tablet Take by mouth.    Marland Kitchen amiodarone (PACERONE) 200 MG tablet Take 1 tablet (200 mg total) by mouth daily. 90 tablet 3  . amLODipine (NORVASC) 5 MG tablet Take 5 mg by mouth.    . cholecalciferol (VITAMIN D) 1000 UNITS tablet Take 1 tablet (1,000 Units total) by mouth every morning. 30 tablet 0  . colchicine (COLCRYS) 0.6 MG tablet TAKE ONE TABLET BY MOUTH TWICE DAILY FOR TEN DAYS    . escitalopram (LEXAPRO) 10 MG tablet Take 1 tablet (10 mg total) by mouth every morning. 30  tablet 0  . esomeprazole (NEXIUM) 40 MG capsule Take 1 capsule (40 mg total) by mouth every morning. 30 capsule 0  . loratadine (CLARITIN REDITABS) 10 MG dissolvable tablet Take 10 mg by mouth daily.    . Multiple Vitamin (MULTIVITAMIN) tablet Take 1 tablet by mouth daily.    . nitroGLYCERIN (NITROSTAT) 0.4 MG SL tablet Place 1 tablet (0.4 mg total) under the tongue every 5 (five) minutes as needed for chest pain. Up to 3 doses. If no relief after 3rd dose, proceed to ED for evaluation. 25 tablet 3  . pravastatin (PRAVACHOL) 40 MG tablet Take 1 tablet (40 mg total) by mouth every morning. 30 tablet 0  . pyridOXINE (VITAMIN B-6) 100 MG tablet Take 100 mg by mouth every morning.     . rivaroxaban (XARELTO) 20 MG TABS tablet Take 1 tablet (20 mg total) by mouth daily. 30 tablet 0  . triamcinolone (NASACORT ALLERGY 24HR) 55 MCG/ACT AERO nasal inhaler Place 2 sprays into the nose daily.    . vitamin E 400 UNIT capsule Take 400 Units by mouth daily.     No current facility-administered medications for this visit.    Allergies:  Asa [aspirin]   Social History: The patient  reports that he has been smoking Cigarettes.  He started smoking about  65 years ago. He has a 60.00 pack-year smoking history. He does not have any smokeless tobacco history on file. He reports that he drinks alcohol. He reports that he does not use drugs.   Family History: The patient's family history includes Arrhythmia in his mother; CAD in his brother; Leukemia in his father.   ROS:  Please see the history of present illness. Otherwise, complete review of systems is positive for {NONE DEFAULTED:18576::"none"}.  All other systems are reviewed and negative.   Physical Exam: VS:  There were no vitals taken for this visit., BMI There is no height or weight on file to calculate BMI.  Wt Readings from Last 3 Encounters:  10/01/15 169 lb 12.8 oz (77 kg)  07/03/15 166 lb 3.2 oz (75.4 kg)  05/14/15 164 lb (74.4 kg)    General:  Elderly male, appears comfortable at rest. HEENT: Conjunctiva and lids normal, oropharynx clear. Neck: Supple, no elevated JVP or carotid bruits, no thyromegaly. Lungs: Diminished breath sounds, nonlabored breathing at rest. Cardiac: Regular rate and rhythm, no S3, 2/6 basal systolic murmur, no pericardial rub. Abdomen: Soft, nontender, bowel sounds present, no guarding or rebound. Extremities: No pitting edema, distal pulses 2+. Skin: Warm and dry. Musculoskeletal: No kyphosis. Neuropsychiatric: Alert and oriented x3, affect grossly appropriate.  ECG: I personally reviewed the tracing from 4/70/2070 was showed sinus rhythm with right bundle branch block and left anterior fascicular block.  Recent Labwork: 04/20/2015: BUN 16; Creatinine, Ser 0.81; Hemoglobin 12.8; Magnesium 1.4; Platelets 178; Potassium 3.9; Sodium 139 09/27/2015: ALT 39; AST 74; TSH 1.44   Other Studies Reviewed Today:  Echocardiogram 04/20/2015: Study Conclusions  - Left ventricle: The cavity size was normal. Wall thickness was increased increased in a pattern of mild to moderate LVH. Systolic function was normal. The estimated ejection fraction was in the range of 55% to 60%. Doppler parameters are consistent with abnormal left ventricular relaxation (grade 1 diastolic dysfunction). - Aortic valve: Moderately calcified annulus. Moderately thickened leaflets. Uncertain number of leaflets. There is AV sclerosis without stenosis. There was mild to moderate regurgitation. Valve area (VTI): 2.63 cm^2. Valve area (Vmax): 2.6 cm^2. Valve area (Vmean): 2.52 cm^2. - Mitral valve: Mildly calcified annulus. Mildly thickened leaflets. - Atrial septum: No defect or patent foramen ovale was identified. - Technically difficult study.  Assessment and Plan:   Current medicines were reviewed with the patient today.  No orders of the defined types were placed in this  encounter.   Disposition:  Signed, Satira Sark, MD, [facility] 03/28/2016 11:19 AM    [facility] at [facility] [address], [place], [place] [address] Phone: [phone]; Fax: [phone]

## 2016-03-31 ENCOUNTER — Ambulatory Visit: Payer: Medicare Other | Admitting: Cardiology

## 2016-05-08 NOTE — Progress Notes (Signed)
Cardiology Office Note  Date: 05/13/2016   ID: Jonathon Conway, DOB 1931-06-27, MRN [id number]  PCP: Sherrie Mustache, MD  Primary Cardiologist: Rozann Lesches, MD   Chief Complaint  Patient presents with  . PSVT  . Atrial Fibrillation    History of Present Illness: Jonathon Conway is an 82 y.o. male last seen in April. He presents for a routine follow-up visit. He does not report any significant palpitations or chest pain. States that he ran out of his amiodarone a few weeks ago. Still lives by himself, functional with ADLs. He does not report any falls or bleeding problems.  Reviewed his medications. Cardiac regimen is to include amiodarone, amlodipine, Pravachol, and Xarelto.  He states that he will be seeing Dr. Edrick Oh soon for a follow-up visit.  I reviewed his most recent lab work.  Past Medical History:  Diagnosis Date  . Coronary atherosclerosis    Documented by chest CT and PET imaging  . Depression   . Essential hypertension, benign   . GERD (gastroesophageal reflux disease)   . Gout   . History of pulmonary embolism   . Hypercholesteremia   . Lung nodule    Open resection at [facility] August 2015 - well-differentiated adenocarcinoma   . Paroxysmal atrial fibrillation (HCC)   . Prostate cancer [facility])    Status post radioactive seed implantation  . PSVT (paroxysmal supraventricular tachycardia) ([place])     Past Surgical History:  Procedure Laterality Date  . FRACTURE SURGERY    . HEMORRHOID SURGERY    . HERNIA REPAIR    . Left elbow surgery    . PROSTATE SURGERY     Prostate seed implantation  . Pulmonary nodule resection    . TONSILLECTOMY      Current Outpatient Prescriptions  Medication Sig Dispense Refill  . acetaminophen (TYLENOL) 500 MG tablet Take 1,000 mg by mouth every 6 (six) hours as needed for moderate pain.    Marland Kitchen albuterol (PROVENTIL HFA;VENTOLIN HFA) 108 (90 BASE) MCG/ACT inhaler Inhale 2 puffs into the lungs every 4 (four)  hours as needed for wheezing or shortness of breath. 1 Inhaler 1  . allopurinol (ZYLOPRIM) 300 MG tablet Take by mouth.    Marland Kitchen amiodarone (PACERONE) 200 MG tablet Take 1 tablet (200 mg total) by mouth daily. 90 tablet 3  . amLODipine (NORVASC) 5 MG tablet Take 5 mg by mouth.    . cholecalciferol (VITAMIN D) 1000 UNITS tablet Take 1 tablet (1,000 Units total) by mouth every morning. 30 tablet 0  . colchicine (COLCRYS) 0.6 MG tablet TAKE ONE TABLET BY MOUTH TWICE DAILY FOR TEN DAYS    . escitalopram (LEXAPRO) 10 MG tablet Take 1 tablet (10 mg total) by mouth every morning. 30 tablet 0  . loratadine (CLARITIN REDITABS) 10 MG dissolvable tablet Take 10 mg by mouth daily.    . Multiple Vitamin (MULTIVITAMIN) tablet Take 1 tablet by mouth daily.    . nitroGLYCERIN (NITROSTAT) 0.4 MG SL tablet Place 1 tablet (0.4 mg total) under the tongue every 5 (five) minutes as needed for chest pain. Up to 3 doses. If no relief after 3rd dose, proceed to ED for evaluation. 25 tablet 3  . pantoprazole (PROTONIX) 40 MG tablet Take by mouth.    . pravastatin (PRAVACHOL) 40 MG tablet Take 1 tablet (40 mg total) by mouth every morning. 30 tablet 0  . pyridOXINE (VITAMIN B-6) 100 MG tablet Take 100 mg by mouth every morning.     Marland Kitchen  rivaroxaban (XARELTO) 20 MG TABS tablet Take 1 tablet (20 mg total) by mouth daily. 30 tablet 0  . triamcinolone (NASACORT ALLERGY 24HR) 55 MCG/ACT AERO nasal inhaler Place 2 sprays into the nose daily.    . vitamin E 400 UNIT capsule Take 400 Units by mouth daily.     No current facility-administered medications for this visit.    Allergies:  Asa [aspirin]   Social History: The patient  reports that he has been smoking Cigarettes.  He started smoking about 65 years ago. He has a 60.00 pack-year smoking history. He has never used smokeless tobacco. He reports that he drinks alcohol. He reports that he does not use drugs.   ROS:  Please see the history of present illness. Otherwise, complete  review of systems is positive for hearing loss - uses hearing aids.  All other systems are reviewed and negative.   Physical Exam: VS:  BP 130/78   Pulse 76   Ht 5\' 10"  (1.778 m)   Wt 159 lb (72.1 kg)   SpO2 94%   BMI 22.81 kg/m , BMI Body mass index is 22.81 kg/m.  Wt Readings from Last 3 Encounters:  05/13/16 159 lb (72.1 kg)  10/01/15 169 lb 12.8 oz (77 kg)  07/03/15 166 lb 3.2 oz (75.4 kg)    General: Elderly male, appears comfortable at rest. HEENT: Conjunctiva and lids normal, oropharynx clear. Neck: Supple, no elevated JVP or carotid bruits, no thyromegaly. Lungs: Diminished breath sounds, nonlabored breathing at rest. Cardiac: Regular rate and rhythm, no S3, 2/6 basal systolic murmur, no pericardial rub. Abdomen: Soft, nontender, bowel sounds present, no guarding or rebound. Extremities: No pitting edema, distal pulses 2+. Skin: Warm and dry. Musculoskeletal: No kyphosis. Neuropsychiatric: Alert and oriented x3, affect grossly appropriate.  ECG: I personally reviewed the tracing from 10/01/2015 which showed sinus rhythm with incomplete right bundle branch block and left anterior fascicular block.  Recent Labwork: 09/27/2015: ALT 39; AST 74; TSH 1.44  August 2017: Hemoglobin 14.6, platelets 239, potassium 3.7, BUN 8, creatinine 0.7, AST 53, ALT 27, cholesterol 163, triglycerides 201, HDL 67, LDL 56  Other Studies Reviewed Today:  Echocardiogram 04/20/2015: Study Conclusions  - Left ventricle: The cavity size was normal. Wall thickness was increased increased in a pattern of mild to moderate LVH. Systolic function was normal. The estimated ejection fraction was in the range of 55% to 60%. Doppler parameters are consistent with abnormal left ventricular relaxation (grade 1 diastolic dysfunction). - Aortic valve: Moderately calcified annulus. Moderately thickened leaflets. Uncertain number of leaflets. There is AV sclerosis without stenosis. There was  mild to moderate regurgitation. Valve area (VTI): 2.63 cm^2. Valve area (Vmax): 2.6 cm^2. Valve area (Vmean): 2.52 cm^2. - Mitral valve: Mildly calcified annulus. Mildly thickened leaflets. - Atrial septum: No defect or patent foramen ovale was identified. - Technically difficult study.  Assessment and Plan:  1. History of PSVT and paroxysmal atrial fibrillation. We will refill amiodarone, continue on Xarelto. He does not report any palpitations. Reviewed last lab work from August. Should have TSH checked this year as well. Also annual eye exams.  2. Essential hypertension, blood pressure is adequately controlled today.  3. Coronary atherosclerosis documented by chest CT and PET imaging. No chest pain reported. He is on statin therapy.  4. Hyperlipidemia, on Pravachol. Last LDL 56.  Current medicines were reviewed with the patient today.   Disposition: Follow-up in 6 months.  Signed, Satira Sark, MD, [facility] 05/13/2016 1:31 PM  [place] [facility] [address], [place], [place] [address] Phone: [phone]; Fax: [phone]

## 2016-05-13 ENCOUNTER — Encounter: Payer: Self-pay | Admitting: Cardiology

## 2016-05-13 ENCOUNTER — Ambulatory Visit (INDEPENDENT_AMBULATORY_CARE_PROVIDER_SITE_OTHER): Payer: Medicare Other | Admitting: Cardiology

## 2016-05-13 VITALS — BP 130/78 | HR 76 | Ht 70.0 in | Wt 159.0 lb

## 2016-05-13 DIAGNOSIS — I2510 Atherosclerotic heart disease of native coronary artery without angina pectoris: Secondary | ICD-10-CM

## 2016-05-13 DIAGNOSIS — I471 Supraventricular tachycardia, unspecified: Secondary | ICD-10-CM

## 2016-05-13 DIAGNOSIS — I10 Essential (primary) hypertension: Secondary | ICD-10-CM | POA: Diagnosis not present

## 2016-05-13 DIAGNOSIS — I480 Paroxysmal atrial fibrillation: Secondary | ICD-10-CM

## 2016-05-13 MED ORDER — AMIODARONE HCL 200 MG PO TABS: 200.0000 mg | ORAL_TABLET | Freq: Every day | ORAL | 3 refills | Status: AC

## 2016-05-13 NOTE — Patient Instructions (Signed)

## 2016-11-12 ENCOUNTER — Emergency Department (HOSPITAL_COMMUNITY)
Admission: EM | Admit: 2016-11-12 | Discharge: 2016-11-13 | Disposition: A | Discharge status: Home / Self Care | Payer: Medicare Other | Attending: Emergency Medicine | Admitting: Emergency Medicine

## 2016-11-12 ENCOUNTER — Emergency Department (HOSPITAL_COMMUNITY): Payer: Medicare Other

## 2016-11-12 DIAGNOSIS — S50311A Abrasion of right elbow, initial encounter: Secondary | ICD-10-CM | POA: Diagnosis not present

## 2016-11-12 DIAGNOSIS — Z8679 Personal history of other diseases of the circulatory system: Secondary | ICD-10-CM | POA: Insufficient documentation

## 2016-11-12 DIAGNOSIS — Z7901 Long term (current) use of anticoagulants: Secondary | ICD-10-CM | POA: Diagnosis not present

## 2016-11-12 DIAGNOSIS — S298XXA Other specified injuries of thorax, initial encounter: Secondary | ICD-10-CM | POA: Diagnosis not present

## 2016-11-12 DIAGNOSIS — Y939 Activity, unspecified: Secondary | ICD-10-CM | POA: Insufficient documentation

## 2016-11-12 DIAGNOSIS — Y92008 Other place in unspecified non-institutional (private) residence as the place of occurrence of the external cause: Secondary | ICD-10-CM | POA: Diagnosis not present

## 2016-11-12 DIAGNOSIS — E860 Dehydration: Secondary | ICD-10-CM | POA: Insufficient documentation

## 2016-11-12 DIAGNOSIS — Y998 Other external cause status: Secondary | ICD-10-CM | POA: Diagnosis not present

## 2016-11-12 DIAGNOSIS — I10 Essential (primary) hypertension: Secondary | ICD-10-CM | POA: Diagnosis not present

## 2016-11-12 DIAGNOSIS — W19XXXA Unspecified fall, initial encounter: Secondary | ICD-10-CM | POA: Insufficient documentation

## 2016-11-12 DIAGNOSIS — R079 Chest pain, unspecified: Secondary | ICD-10-CM | POA: Insufficient documentation

## 2016-11-12 DIAGNOSIS — F17210 Nicotine dependence, cigarettes, uncomplicated: Secondary | ICD-10-CM | POA: Diagnosis not present

## 2016-11-12 DIAGNOSIS — T148XXA Other injury of unspecified body region, initial encounter: Secondary | ICD-10-CM

## 2016-11-12 DIAGNOSIS — S060X0A Concussion without loss of consciousness, initial encounter: Secondary | ICD-10-CM

## 2016-11-12 DIAGNOSIS — S299XXA Unspecified injury of thorax, initial encounter: Secondary | ICD-10-CM | POA: Diagnosis present

## 2016-11-12 LAB — CBC
HCT: 39.8 % (ref 39.0–52.0)
Hemoglobin: 14.3 g/dL (ref 13.0–17.0)
MCH: 35.7 pg — ABNORMAL HIGH (ref 26.0–34.0)
MCHC: 35.9 g/dL (ref 30.0–36.0)
MCV: 99.3 fL (ref 78.0–100.0)
Platelets: 208 10*3/uL (ref 150–400)
RBC: 4.01 MIL/uL — ABNORMAL LOW (ref 4.22–5.81)
RDW: 12.8 % (ref 11.5–15.5)
WBC: 10.4 10*3/uL (ref 4.0–10.5)

## 2016-11-12 LAB — BASIC METABOLIC PANEL
Anion gap: 20 — ABNORMAL HIGH (ref 5–15)
BUN: 11 mg/dL (ref 6–20)
CHLORIDE: 89 mmol/L — AB (ref 101–111)
CO2: 24 mmol/L (ref 22–32)
Calcium: 8.8 mg/dL — ABNORMAL LOW (ref 8.9–10.3)
Creatinine, Ser: 0.91 mg/dL (ref 0.61–1.24)
GFR calc Af Amer: >60 mL/min (ref >60)
GLUCOSE: 73 mg/dL (ref 65–99)
POTASSIUM: 3.3 mmol/L — AB (ref 3.5–5.1)
Sodium: 133 mmol/L — ABNORMAL LOW (ref 135–145)

## 2016-11-12 LAB — ETHANOL: Alcohol, Ethyl (B): 161 mg/dL — ABNORMAL HIGH (ref <5)

## 2016-11-12 MED ORDER — SODIUM CHLORIDE 0.9 % IV BOLUS (SEPSIS)
1000.0000 mL | Freq: Once | INTRAVENOUS | Status: AC
  Administered 2016-11-13: 1000 mL via INTRAVENOUS

## 2016-11-12 NOTE — ED Notes (Signed)
Pt states that he has had several falls lately due to his legs "giving out" and feeling "tired", pt fell tonight while walking in his house, has abrasion and redness noted to forehead area, skin tear noted to right elbow, and tear noted to fingernail on left finger, does admit to drinking cranberry juice and vodka tonight,

## 2016-11-12 NOTE — ED Provider Notes (Signed)
[name]   CSN: [id number] Arrival date & time: 11/12/16  2146   By signing my name below, I, Eunice Blase, attest that this documentation has been prepared under the direction and in the presence of Ripley Fraise, MD. Electronically signed, Eunice Blase, ED Scribe. 11/12/16. 11:59 PM.   History   Chief Complaint Chief Complaint  Patient presents with  . Fall    chest pain after fall   The history is provided by the patient and medical records. No language interpreter was used.  Fall  This is a recurrent problem. The current episode started 6 to 12 hours ago. Associated symptoms include chest pain and shortness of breath. Pertinent negatives include no abdominal pain and no headaches.  Chest Pain   This is a recurrent problem. The current episode started 6 to 12 hours ago. The problem occurs constantly. The problem has been gradually worsening. The pain is associated with movement, breathing and coughing. The pain is moderate. The quality of the pain is described as heavy. The symptoms are aggravated by certain positions and deep breathing. Associated symptoms include shortness of breath and weakness. Pertinent negatives include no abdominal pain, no back pain, no cough, no fever, no headaches, no leg pain, no nausea, no near-syncope, no syncope and no vomiting. Risk factors include being elderly, lack of exercise, male gender and sedentary lifestyle.  His past medical history is significant for anxiety/panic attacks and cancer.    Jonathon Conway is a 83 y.o. male with h/o BIB EMS who presents to the Emergency Department with concern for persistent chest pain onset shortly PTA this evening s/p a fall that occurred earlier today. Pt states he fell in his living room this afternoon d/t leg weakness and notes gradual onset, worsening chest pain in the time since. Apparent head trauma noted. R elbow abrasion noted. He states his chest pain is worse with coughing,  sitting up from a laying down position and deep breathing. Pt reports baseline leg weakness which has been present chronically. He states he takes Xarelto regularly at home. No neck pain, back pain or leg trauma noted. No other complaints at this time.   Pt admits to ETOH use prior to fall Past Medical History:  Diagnosis Date  . Coronary atherosclerosis    Documented by chest CT and PET imaging  . Depression   . Essential hypertension, benign   . GERD (gastroesophageal reflux disease)   . Gout   . History of pulmonary embolism   . Hypercholesteremia   . Lung nodule    Open resection at [facility] August 2015 - well-differentiated adenocarcinoma   . Paroxysmal atrial fibrillation (HCC)   . Prostate cancer [facility])    Status post radioactive seed implantation  . PSVT (paroxysmal supraventricular tachycardia) [facility])     Patient Active Problem List   Diagnosis Date Noted  . Syncope and collapse 04/19/2015  . Hypokalemia 04/19/2015  . Rib fractures 04/19/2015  . Syncope 04/19/2015  . Pulmonary nodule 03/08/2014  . History of syncope 03/08/2014  . Back pain 12/15/2013  . Hx pulmonary embolism 12/15/2013  . Coronary atherosclerosis 02/04/2013  . Abnormal myocardial perfusion study 01/25/2013  . Precordial pain 01/12/2013  . Essential hypertension, benign 01/12/2013  . Mixed hyperlipidemia 01/12/2013  . GERD (gastroesophageal reflux disease) 01/12/2013    Past Surgical History:  Procedure Laterality Date  . FRACTURE SURGERY    . HEMORRHOID SURGERY    . HERNIA REPAIR    . Left elbow  surgery    . PROSTATE SURGERY     Prostate seed implantation  . Pulmonary nodule resection    . TONSILLECTOMY         Home Medications    Prior to Admission medications   Medication Sig Start Date End Date Taking? Authorizing Provider  acetaminophen (TYLENOL) 500 MG tablet Take 1,000 mg by mouth every 6 (six) hours as needed for moderate pain.    [provider]  albuterol  (PROVENTIL HFA;VENTOLIN HFA) 108 (90 BASE) MCG/ACT inhaler Inhale 2 puffs into the lungs every 4 (four) hours as needed for wheezing or shortness of breath. 04/21/15   Kathie Dike, MD  allopurinol (ZYLOPRIM) 300 MG tablet Take by mouth. 05/01/15   [provider]  amiodarone (PACERONE) 200 MG tablet Take 1 tablet (200 mg total) by mouth daily. 05/13/16   Satira Sark, MD  amLODipine (NORVASC) 5 MG tablet Take 5 mg by mouth. 05/01/15   [provider]  cholecalciferol (VITAMIN D) 1000 UNITS tablet Take 1 tablet (1,000 Units total) by mouth every morning. 04/21/15   Kathie Dike, MD  colchicine (COLCRYS) 0.6 MG tablet TAKE ONE TABLET BY MOUTH TWICE DAILY FOR TEN DAYS 03/27/14   [provider]  escitalopram (LEXAPRO) 10 MG tablet Take 1 tablet (10 mg total) by mouth every morning. 04/21/15   Kathie Dike, MD  loratadine (CLARITIN REDITABS) 10 MG dissolvable tablet Take 10 mg by mouth daily.    [provider]  Multiple Vitamin (MULTIVITAMIN) tablet Take 1 tablet by mouth daily.    [provider]  nitroGLYCERIN (NITROSTAT) 0.4 MG SL tablet Place 1 tablet (0.4 mg total) under the tongue every 5 (five) minutes as needed for chest pain. Up to 3 doses. If no relief after 3rd dose, proceed to ED for evaluation. 01/20/13   Serpe, Burna Forts, PA-C  pantoprazole (PROTONIX) 40 MG tablet Take by mouth. 02/21/16 02/20/17  [provider]  pravastatin (PRAVACHOL) 40 MG tablet Take 1 tablet (40 mg total) by mouth every morning. 04/21/15   Kathie Dike, MD  pyridOXINE (VITAMIN B-6) 100 MG tablet Take 100 mg by mouth every morning.     [provider]  rivaroxaban (XARELTO) 20 MG TABS tablet Take 1 tablet (20 mg total) by mouth daily. 04/21/15   Kathie Dike, MD  triamcinolone (NASACORT ALLERGY 24HR) 55 MCG/ACT AERO nasal inhaler Place 2 sprays into the nose daily.    [provider]  vitamin E 400 UNIT capsule Take 400 Units by mouth  daily.    [provider]    Family History Family History  Problem Relation Age of Onset  . Arrhythmia Mother        Pacemaker  . CAD Brother        CABG  . Leukemia Father     Social History Social History  Substance Use Topics  . Smoking status: Current Some Day Smoker    Packs/day: 1.00    Years: 60.00    Types: Cigarettes    Start date: [birth year]/09/1951  . Smokeless tobacco: Never Used  . Alcohol use 0.0 oz/week     Comment: Occassional wine     Allergies   Asa [aspirin]   Review of Systems Review of Systems  Constitutional: Negative for fever.  Respiratory: Positive for shortness of breath. Negative for cough.   Cardiovascular: Positive for chest pain. Negative for syncope and near-syncope.  Gastrointestinal: Negative for abdominal pain, nausea and vomiting.  Musculoskeletal: Negative for back pain.  Skin: Positive for wound.  Neurological: Positive for weakness. Negative for headaches.  All other systems reviewed and are negative.    Physical Exam Updated Vital Signs BP (!) 151/70   Pulse 64   Temp 98.1 F (36.7 C) (Oral)   Resp 12   Ht 5\' 10"  (1.778 m)   Wt 160 lb (72.6 kg)   SpO2 95%   BMI 22.96 kg/m   Physical Exam CONSTITUTIONAL: elderly and frail, patient hard of hearing HEAD: abrasion to forehead. No other signs of trauma. EYES: EOMI/PERRL ENMT: Mucous membranes moist, no facial trauma NECK: supple no meningeal signs SPINE/BACK:entire spine nontender,No bruising/crepitance/stepoffs noted to spine CV: S1/S2 noted, no murmurs/rubs/gallops noted LUNGS: Lungs are clear to auscultation bilaterally, no apparent distress Chest: Diffuse chest wall tenderness. No bruising or crepitus. ABDOMEN: soft, nontender, no rebound or guarding, bowel sounds noted throughout abdomen. No RUQ tenderness. GU: no cva tenderness NEURO: Pt is awake/alert/appropriate, moves all extremitiesx4.  No facial droop.   EXTREMITIES: pulses normal/equal, full ROM, All  extremities/joints palpated/ranged and non tender, small tear noted to nail of L ring finger, no subungual hematoma SKIN: warm, color normal, scattered abrasions to extremities PSYCH: no abnormalities of mood noted, alert and oriented to situation   ED Treatments / Results  DIAGNOSTIC STUDIES: Oxygen Saturation is 95% on RA, adequate by my interpretation.    COORDINATION OF CARE: 11:52 PM-Discussed next steps with pt. Pt verbalized understanding and is agreeable with the plan. Will order imaging and medications.   Labs (all labs ordered are listed, but only abnormal results are displayed) Labs Reviewed  BASIC METABOLIC PANEL - Abnormal; Notable for the following:       Result Value   Sodium 133 (*)    Potassium 3.3 (*)    Chloride 89 (*)    Calcium 8.8 (*)    Anion gap 20 (*)    All other components within normal limits  CBC - Abnormal; Notable for the following:    RBC 4.01 (*)    MCH 35.7 (*)    All other components within normal limits  URINALYSIS, ROUTINE W REFLEX MICROSCOPIC - Abnormal; Notable for the following:    Hgb urine dipstick LARGE (*)    Ketones, ur 5 (*)    Leukocytes, UA TRACE (*)    Squamous Epithelial / LPF 0-5 (*)    All other components within normal limits  ETHANOL - Abnormal; Notable for the following:    Alcohol, Ethyl (B) 161 (*)    All other components within normal limits    EKG  EKG Interpretation  Date/Time:  Wednesday Nov 12 2016 21:51:13 EDT Ventricular Rate:  68 PR Interval:    QRS Duration: 119 QT Interval:  488 QTC Calculation: 520 R Axis:   -77 Text Interpretation:  Sinus rhythm Incomplete RBBB and LAFB Probable lateral infarct, old No significant change since last tracing Interpretation limited secondary to artifact Confirmed by Ripley Fraise ([id number]) on 11/12/2016 11:42:25 PM       Radiology Dg Chest 2 View  Result Date: 11/12/2016 CLINICAL DATA:  Status post fall, with generalized chest pain. Forehead abrasion. Initial  encounter. EXAM: CHEST  2 VIEW COMPARISON:  Chest radiograph performed 04/19/2015 FINDINGS: The lungs are well-aerated. Postoperative change is noted at the right midlung. There is no evidence of focal opacification, pleural effusion or pneumothorax. The heart is normal in size; the mediastinal contour is within normal limits. No acute osseous abnormalities are seen. Chronic left-sided rib deformities are seen. IMPRESSION:  No acute cardiopulmonary process seen. No displaced rib fractures identified. Electronically Signed   By: Garald Balding M.D.   On: 11/12/2016 23:07   Ct Head Wo Contrast  Result Date: 11/12/2016 CLINICAL DATA:  83 year old male with fall and trauma to the forehead. EXAM: CT HEAD WITHOUT CONTRAST CT CERVICAL SPINE WITHOUT CONTRAST TECHNIQUE: Multidetector CT imaging of the head and cervical spine was performed following the standard protocol without intravenous contrast. Multiplanar CT image reconstructions of the cervical spine were also generated. COMPARISON:  Head CT dated 04/19/2015 FINDINGS: CT HEAD FINDINGS Brain: There is mild age-related atrophy and chronic microvascular ischemic changes. There is no acute intracranial hemorrhage. No mass effect or midline shift noted. No extra-axial fluid collection. Vascular: No hyperdense vessel or unexpected calcification. Skull: Normal. Negative for fracture or focal lesion. Sinuses/Orbits: Mild mucoperiosteal thickening of paranasal sinuses. No air-fluid levels. The mastoid air cells are clear. Other: None CT CERVICAL SPINE FINDINGS Alignment: No acute subluxation. There is grade 1 C7-T1 anterolisthesis. Skull base and vertebrae: No acute fracture. The bones are osteopenic. Soft tissues and spinal canal: No prevertebral fluid or swelling. No visible canal hematoma. Disc levels: Multilevel degenerative changes with disc space narrowing and endplate irregularity. Multilevel facet hypertrophy primarily involving C3-C4 and C4-C5. Upper chest: No  acute findings. Small amount of secretions noted in the upper trachea. Other: Bilateral carotid bulb atherosclerotic plaques. IMPRESSION: 1. No acute intracranial hemorrhage. Mild age-related atrophy and chronic microvascular ischemic changes. 2. No acute/traumatic cervical spine pathology. Chronic degenerative changes and grade 1 C7-T1 anterolisthesis. Electronically Signed   By: Anner Crete M.D.   On: 11/12/2016 23:04   Ct Cervical Spine Wo Contrast  Result Date: 11/12/2016 CLINICAL DATA:  83 year old male with fall and trauma to the forehead. EXAM: CT HEAD WITHOUT CONTRAST CT CERVICAL SPINE WITHOUT CONTRAST TECHNIQUE: Multidetector CT imaging of the head and cervical spine was performed following the standard protocol without intravenous contrast. Multiplanar CT image reconstructions of the cervical spine were also generated. COMPARISON:  Head CT dated 04/19/2015 FINDINGS: CT HEAD FINDINGS Brain: There is mild age-related atrophy and chronic microvascular ischemic changes. There is no acute intracranial hemorrhage. No mass effect or midline shift noted. No extra-axial fluid collection. Vascular: No hyperdense vessel or unexpected calcification. Skull: Normal. Negative for fracture or focal lesion. Sinuses/Orbits: Mild mucoperiosteal thickening of paranasal sinuses. No air-fluid levels. The mastoid air cells are clear. Other: None CT CERVICAL SPINE FINDINGS Alignment: No acute subluxation. There is grade 1 C7-T1 anterolisthesis. Skull base and vertebrae: No acute fracture. The bones are osteopenic. Soft tissues and spinal canal: No prevertebral fluid or swelling. No visible canal hematoma. Disc levels: Multilevel degenerative changes with disc space narrowing and endplate irregularity. Multilevel facet hypertrophy primarily involving C3-C4 and C4-C5. Upper chest: No acute findings. Small amount of secretions noted in the upper trachea. Other: Bilateral carotid bulb atherosclerotic plaques. IMPRESSION: 1.  No acute intracranial hemorrhage. Mild age-related atrophy and chronic microvascular ischemic changes. 2. No acute/traumatic cervical spine pathology. Chronic degenerative changes and grade 1 C7-T1 anterolisthesis. Electronically Signed   By: Anner Crete M.D.   On: 11/12/2016 23:04    Procedures Procedures (including critical care time)  Medications Ordered in ED Medications  sodium chloride 0.9 % bolus 1,000 mL (0 mLs Intravenous Stopped 11/13/16 0032)  acetaminophen (TYLENOL) tablet 650 mg (650 mg Oral Given 11/13/16 0009)  sodium chloride 0.9 % bolus 1,000 mL (0 mLs Intravenous Stopped 11/13/16 0226)     Initial Impression / Assessment and Plan /  ED Course  I have reviewed the triage vital signs and the nursing notes.  Pertinent labs & imaging results that were available during my care of the patient were reviewed by me and considered in my medical decision making (see chart for details).    Ct head/cspine due to fall, evidence of head injury, he is intoxicated and therefore can not r/o cspine or head injury.  He is also on anticoagulants  Pt in the ED s/p fall. Fall likely due to ETOH intoxication Pt is also noted to be dehydrated He was given IV fluids, he felt improved, he could ambulate and felt near baseline He is requesting d/c home His main issue was chest wall pain s/p fall.  He had reproducible tenderness No signs of rib fracture/PTX No abdominal trauma noted Pt is on xarelto, but no hematoma or other acute injury noted  Will d/c home We discussed strict ER return precautions   Final Clinical Impressions(s) / ED Diagnoses   Final diagnoses:  Fall, initial encounter  Blunt trauma to chest, initial encounter  Dehydration  Concussion without loss of consciousness, initial encounter  Abrasion    New Prescriptions New Prescriptions   No medications on file  I personally performed the services described in this documentation, which was scribed in my presence.  The recorded information has been reviewed and is accurate.        Ripley Fraise, MD 11/13/16 [phone]

## 2016-11-12 NOTE — ED Notes (Signed)
ED Provider at bedside. 

## 2016-11-12 NOTE — ED Notes (Signed)
Pt updated on plan of care,  

## 2016-11-12 NOTE — ED Notes (Signed)
Pt returned from xray

## 2016-11-12 NOTE — ED Triage Notes (Signed)
Pt in by [facility] after reportedly falling at home.  Pt states he fell earlier in the evening and then went to bed when he realized his chest was hurting.  Pt also has an abrasion to his right forehead and right elbow.   Pt admits to drinking tonight and has had recent falls.

## 2016-11-12 NOTE — ED Notes (Signed)
Patient transported to CT 

## 2016-11-13 DIAGNOSIS — S298XXA Other specified injuries of thorax, initial encounter: Secondary | ICD-10-CM | POA: Diagnosis not present

## 2016-11-13 LAB — URINALYSIS, ROUTINE W REFLEX MICROSCOPIC
Bacteria, UA: NONE SEEN
Bilirubin Urine: NEGATIVE
Glucose, UA: NEGATIVE mg/dL
KETONES UR: 5 mg/dL — AB
Nitrite: NEGATIVE
PH: 6 (ref 5.0–8.0)
Protein, ur: NEGATIVE mg/dL
SPECIFIC GRAVITY, URINE: 1.006 (ref 1.005–1.030)

## 2016-11-13 MED ORDER — ACETAMINOPHEN 325 MG PO TABS
650.0000 mg | ORAL_TABLET | Freq: Once | ORAL | Status: AC
  Administered 2016-11-13: 650 mg via ORAL
  Filled 2016-11-13: qty 2

## 2016-11-13 MED ORDER — SODIUM CHLORIDE 0.9 % IV BOLUS (SEPSIS)
1000.0000 mL | Freq: Once | INTRAVENOUS | Status: AC
  Administered 2016-11-13: 1000 mL via INTRAVENOUS

## 2016-11-13 NOTE — ED Notes (Signed)
ED Provider at bedside. 

## 2016-11-13 NOTE — ED Notes (Signed)
Pt walked around bed well, did hold on to bed but stated he walks with a cane at home.

## 2016-11-13 NOTE — ED Notes (Signed)
Left finger, right elbow and forehead cleaned with NS, pt tolerated well,

## 2017-06-16 DEATH — deceased
